# Patient Record
Sex: Female | Born: 1970 | Race: Black or African American | Hispanic: No | Marital: Married | Smoking: Never smoker
Health system: Southern US, Community
[De-identification: ages and names within clinical notes are randomized; demographics above are authoritative.]

## PROBLEM LIST (undated history)

## (undated) DIAGNOSIS — I8390 Asymptomatic varicose veins of unspecified lower extremity: Secondary | ICD-10-CM

## (undated) DIAGNOSIS — L659 Nonscarring hair loss, unspecified: Secondary | ICD-10-CM

## (undated) DIAGNOSIS — C169 Malignant neoplasm of stomach, unspecified: Secondary | ICD-10-CM

## (undated) DIAGNOSIS — A749 Chlamydial infection, unspecified: Secondary | ICD-10-CM

## (undated) HISTORY — DX: Nonscarring hair loss, unspecified: L65.9

## (undated) HISTORY — DX: Chlamydial infection, unspecified: A74.9

## (undated) HISTORY — DX: Asymptomatic varicose veins of unspecified lower extremity: I83.90

## (undated) HISTORY — DX: Malignant neoplasm of stomach, unspecified: C16.9

## (undated) HISTORY — PX: PORTACATH PLACEMENT: SHX2246

## (undated) HISTORY — PX: BREAST REDUCTION SURGERY: SHX8

---

## 2004-02-26 HISTORY — PX: BREAST LUMPECTOMY: SHX2

## 2011-08-27 ENCOUNTER — Emergency Department (HOSPITAL_COMMUNITY): Payer: Managed Care, Other (non HMO)

## 2011-08-27 ENCOUNTER — Emergency Department (HOSPITAL_COMMUNITY)
Admission: EM | Admit: 2011-08-27 | Discharge: 2011-08-27 | Disposition: A | Discharge status: Home / Self Care | Payer: Managed Care, Other (non HMO) | Attending: Emergency Medicine | Admitting: Emergency Medicine

## 2011-08-27 ENCOUNTER — Other Ambulatory Visit: Payer: Self-pay | Admitting: Internal Medicine

## 2011-08-27 ENCOUNTER — Encounter (HOSPITAL_COMMUNITY): Payer: Self-pay | Admitting: Emergency Medicine

## 2011-08-27 DIAGNOSIS — R197 Diarrhea, unspecified: Secondary | ICD-10-CM | POA: Insufficient documentation

## 2011-08-27 DIAGNOSIS — K5289 Other specified noninfective gastroenteritis and colitis: Secondary | ICD-10-CM | POA: Insufficient documentation

## 2011-08-27 DIAGNOSIS — K529 Noninfective gastroenteritis and colitis, unspecified: Secondary | ICD-10-CM

## 2011-08-27 DIAGNOSIS — R112 Nausea with vomiting, unspecified: Secondary | ICD-10-CM | POA: Insufficient documentation

## 2011-08-27 DIAGNOSIS — R109 Unspecified abdominal pain: Secondary | ICD-10-CM

## 2011-08-27 LAB — COMPREHENSIVE METABOLIC PANEL
Albumin: 3.4 g/dL — ABNORMAL LOW (ref 3.5–5.2)
Alkaline Phosphatase: 74 U/L (ref 39–117)
BUN: 3 mg/dL — ABNORMAL LOW (ref 6–23)
Calcium: 9.1 mg/dL (ref 8.4–10.5)
Creatinine, Ser: 0.59 mg/dL (ref 0.50–1.10)
Potassium: 3.3 mEq/L — ABNORMAL LOW (ref 3.5–5.1)
Total Protein: 7.7 g/dL (ref 6.0–8.3)

## 2011-08-27 LAB — CBC
HCT: 37.4 % (ref 36.0–46.0)
MCHC: 34.8 g/dL (ref 30.0–36.0)
RDW: 12.5 % (ref 11.5–15.5)

## 2011-08-27 LAB — URINALYSIS, ROUTINE W REFLEX MICROSCOPIC
Leukocytes, UA: NEGATIVE
Nitrite: NEGATIVE
Specific Gravity, Urine: 1.011 (ref 1.005–1.030)
pH: 6.5 (ref 5.0–8.0)

## 2011-08-27 LAB — LIPASE, BLOOD: Lipase: 13 U/L (ref 11–59)

## 2011-08-27 MED ORDER — CIPROFLOXACIN HCL 500 MG PO TABS: 500.0000 mg | ORAL_TABLET | Freq: Two times a day (BID) | ORAL | Status: AC

## 2011-08-27 MED ORDER — METRONIDAZOLE 500 MG PO TABS
500.0000 mg | ORAL_TABLET | Freq: Once | ORAL | Status: AC
  Administered 2011-08-27: 500 mg via ORAL
  Filled 2011-08-27: qty 1

## 2011-08-27 MED ORDER — ONDANSETRON HCL 4 MG/2ML IJ SOLN
4.0000 mg | Freq: Once | INTRAMUSCULAR | Status: AC
  Administered 2011-08-27: 4 mg via INTRAVENOUS
  Filled 2011-08-27: qty 2

## 2011-08-27 MED ORDER — MORPHINE SULFATE 4 MG/ML IJ SOLN
4.0000 mg | Freq: Once | INTRAMUSCULAR | Status: AC
  Administered 2011-08-27: 4 mg via INTRAVENOUS
  Filled 2011-08-27: qty 1

## 2011-08-27 MED ORDER — METRONIDAZOLE 500 MG PO TABS: 500.0000 mg | ORAL_TABLET | Freq: Two times a day (BID) | ORAL | Status: AC

## 2011-08-27 MED ORDER — SODIUM CHLORIDE 0.9 % IV BOLUS (SEPSIS)
1000.0000 mL | Freq: Once | INTRAVENOUS | Status: AC
  Administered 2011-08-27: 1000 mL via INTRAVENOUS

## 2011-08-27 MED ORDER — ONDANSETRON HCL 4 MG PO TABS: 4.0000 mg | ORAL_TABLET | Freq: Three times a day (TID) | ORAL | Status: AC | PRN

## 2011-08-27 MED ORDER — PERCOCET 5-325 MG PO TABS: 1.0000 | ORAL_TABLET | ORAL | Status: AC | PRN

## 2011-08-27 MED ORDER — CIPROFLOXACIN HCL 500 MG PO TABS
500.0000 mg | ORAL_TABLET | Freq: Once | ORAL | Status: AC
  Administered 2011-08-27: 500 mg via ORAL
  Filled 2011-08-27: qty 1

## 2011-08-27 MED ORDER — IOHEXOL 300 MG/ML  SOLN: 20.0000 mL | INTRAMUSCULAR | Status: AC

## 2011-08-27 MED ORDER — IOHEXOL 300 MG/ML  SOLN
80.0000 mL | Freq: Once | INTRAMUSCULAR | Status: AC | PRN
  Administered 2011-08-27: 80 mL via INTRAVENOUS

## 2011-08-27 MED ORDER — POTASSIUM CHLORIDE CRYS ER 20 MEQ PO TBCR
20.0000 meq | EXTENDED_RELEASE_TABLET | Freq: Once | ORAL | Status: AC
  Administered 2011-08-27: 20 meq via ORAL
  Filled 2011-08-27: qty 1

## 2011-08-27 NOTE — ED Notes (Signed)
States she is feeling better. Her stomach feels "a little sore"

## 2011-08-27 NOTE — ED Notes (Signed)
Pt eating [place] Sandwich  And snack before getting antibiotics due to not eating all day.

## 2011-08-27 NOTE — ED Notes (Signed)
Specimen cup given for stool culture.

## 2011-08-27 NOTE — ED Provider Notes (Signed)
11:37 AM Patient is in CDU holding for CT abd/pelvis.  Sign out received from Marlon Pel, PA-C.  Pt with diffuse abdominal pain, worse in LLQ, with N/V/D x 2 days.  Pt reports pain and nausea are currently well controlled, she has finished drinking the contrast.  On exam, pt is A&Ox4, NAD, RRR, CTAB, abd is soft, nondistended, diffusely ttp, worst in LLQ, no guarding, no rebound.  Will continue to follow.    3:44 PM Discussed results with patient.  Pt reports pain is starting to return, but generally well controlled.  Abdominal exam continues to be benign - soft, nondistended, diffuse tenderness worse on the left, no guarding, no rebound.  Plan is for d/c home with cipro, flagyl (first dose of both given here), percocet, zofran, GI and PCP follow up. Stool sample to be collected prior to d/c. Discussed return precautions.  Patient verbalizes understanding and agrees with plan.    Results for orders placed during the hospital encounter of 08/27/11  CBC      Component Value Range   WBC 5.7  4.0 - 10.5 K/uL   RBC 4.30  3.87 - 5.11 MIL/uL   Hemoglobin 13.0  12.0 - 15.0 g/dL   HCT 16.1  09.6 - 04.5 %   MCV 87.0  78.0 - 100.0 fL   MCH 30.2  26.0 - 34.0 pg   MCHC 34.8  30.0 - 36.0 g/dL   RDW 40.9  81.1 - 91.4 %   Platelets 325  150 - 400 K/uL  COMPREHENSIVE METABOLIC PANEL      Component Value Range   Sodium 135  135 - 145 mEq/L   Potassium 3.3 (*) 3.5 - 5.1 mEq/L   Chloride 99  96 - 112 mEq/L   CO2 25  19 - 32 mEq/L   Glucose, Bld 85  70 - 99 mg/dL   BUN 3 (*) 6 - 23 mg/dL   Creatinine, Ser 7.82  0.50 - 1.10 mg/dL   Calcium 9.1  8.4 - 95.6 mg/dL   Total Protein 7.7  6.0 - 8.3 g/dL   Albumin 3.4 (*) 3.5 - 5.2 g/dL   AST 12  0 - 37 U/L   ALT 13  0 - 35 U/L   Alkaline Phosphatase 74  39 - 117 U/L   Total Bilirubin 0.4  0.3 - 1.2 mg/dL   GFR calc non Af Amer >90  >90 mL/min   GFR calc Af Amer >90  >90 mL/min  LIPASE, BLOOD      Component Value Range   Lipase 13  11 - 59 U/L    URINALYSIS, ROUTINE W REFLEX MICROSCOPIC      Component Value Range   Color, Urine YELLOW  YELLOW   APPearance CLEAR  CLEAR   Specific Gravity, Urine 1.011  1.005 - 1.030   pH 6.5  5.0 - 8.0   Glucose, UA NEGATIVE  NEGATIVE mg/dL   Hgb urine dipstick NEGATIVE  NEGATIVE   Bilirubin Urine NEGATIVE  NEGATIVE   Ketones, ur 40 (*) NEGATIVE mg/dL   Protein, ur NEGATIVE  NEGATIVE mg/dL   Urobilinogen, UA 0.2  0.0 - 1.0 mg/dL   Nitrite NEGATIVE  NEGATIVE   Leukocytes, UA NEGATIVE  NEGATIVE  PREGNANCY, URINE      Component Value Range   Preg Test, Ur NEGATIVE  NEGATIVE   Ct Abdomen Pelvis W Contrast  08/27/2011  *RADIOLOGY REPORT*  Clinical Data: Periumbilical and left lower quadrant pain.  Nausea vomiting and diarrhea.  Chills since Sunday.  CT ABDOMEN AND PELVIS WITH CONTRAST  Technique:  Multidetector CT imaging of the abdomen and pelvis was performed following the standard protocol during bolus administration of intravenous contrast.  Contrast: 80mL OMNIPAQUE IOHEXOL 300 MG/ML  SOLN  Comparison: None.  Findings: Mild bibasilar atelectasis at the lung bases. Normal heart size without pericardial or pleural effusion.  Normal liver, spleen, stomach, pancreas, gallbladder, biliary tract, adrenal glands.  Normal kidneys.  Small retroperitoneal nodes, without adenopathy.  The rectum is normal.  There is wall thickening throughout the remainder of the colon which is mild to moderate.  The cecum is positioned within the right central pelvis.  The terminal ileum is underdistended, but may be thick-walled on image 64 of series 2.  Appendix likely diminutive and normal on coronal image 39.  Normal caliber of small bowel loops.  Prominent ileocolic mesenteric nodes.  Index node measures 8 mm on image 51. No pneumatosis or free intraperitoneal air.  No ascites.  No pelvic sidewall adenopathy.  Normal urinary bladder and uterus. No adnexal mass.  Trace cul-de-sac fluid could be physiologic or related to the bowel  process.  No acute osseous abnormality.  IMPRESSION:  1.  Relatively diffuse colitis, sparing the rectum.  Suspicion of concurrent terminal ileitis (evaluation of this area degraded by underdistension).  Considerations include acute infectious colitis or a somewhat atypical distribution/presentation of Crohn's disease.  Ulcerative colitis is felt unlikely, given absence of the distal involvement. 2.  Prominent ileocolic mesenteric nodes; likely reactive.  Original Report Authenticated By: Consuello Bossier, M.D.      Dillard Cannon [place], [place] 08/27/11 [phone]

## 2011-08-27 NOTE — ED Notes (Signed)
Pt. C/o of chills starting on Friday and on Sunday began having diarrhea and vomiting. Pt. Has taken aleve with no relief.

## 2011-08-27 NOTE — ED Provider Notes (Signed)
History     CSN: [id number]  Arrival date & time 08/27/11  4098   First MD Initiated Contact with Patient 08/27/11 [phone]      Chief Complaint  Patient presents with  . Diarrhea  . Emesis    (Consider location/radiation/quality/duration/timing/severity/associated sxs/prior treatment) HPI   Pt presents to the ED with complaints of severe abdominal pain, with cramping, diarrhea and vomiting since Friday. She states that last night wast he worst of all of the days. She admits to 10 episodes of diarrhea per day and a total of 3 episodes of vomiting. She says the pain is epigastric, periumbilical and then worse in the LLQ. She says that she saw her PCP yesterday who ordered a CT scan but she never received a phone call about where to go or when so she came to the ER for help. She denies having a history of abdominal pains, denies having a scan before. She denies any urinary or vaginal complaints, denies chest pains. Denies being around other sick contacts. Pt is in NAD at this time and VSS.  History reviewed. No pertinent past medical history.  History reviewed. No pertinent past surgical history.  History reviewed. No pertinent family history.  History  Substance Use Topics  . Smoking status: Not on file  . Smokeless tobacco: Not on file  . Alcohol Use: Not on file    OB History    Grav Para Term Preterm Abortions TAB SAB Ect Mult Living                  Review of Systems   HEENT: denies blurry vision or change in hearing PULMONARY: Denies difficulty breathing and SOB CARDIAC: denies chest pain or heart palpitations MUSCULOSKELETAL:  denies being unable to ambulate ABDOMEN AL: abdominal pain with N/V/D GU: denies loss of bowel or urinary control NEURO: denies numbness and tingling in extremities SKIN: no new rashes PSYCH: patient denies anxiety or depression. NECK: Pt denies having neck pain     Allergies  Review of patient's allergies indicates no known  allergies.  Home Medications   Current Outpatient Rx  Name Route Sig Dispense Refill  . NORETHINDRONE-ETH ESTRADIOL 0.4-35 MG-MCG PO TABS Oral Take 1 tablet by mouth daily.      BP 130/86  Pulse 106  Temp 97.5 F (36.4 C) (Oral)  Resp 16  SpO2 100%  LMP 07/30/2011  Physical Exam  Constitutional: She appears well-developed and well-nourished. No distress.  HENT:  Head: Normocephalic and atraumatic.  Mouth/Throat: Uvula is midline.  Eyes: Pupils are equal, round, and reactive to light.  Neck: Trachea normal, normal range of motion and full passive range of motion without pain. Neck supple.  Cardiovascular: Normal rate, regular rhythm, normal heart sounds and normal pulses.   Pulmonary/Chest: Effort normal and breath sounds normal. No respiratory distress. Chest wall is not dull to percussion. She exhibits no tenderness, no crepitus, no edema, no deformity and no retraction.  Abdominal: Normal appearance. There is tenderness in the epigastric area, periumbilical area and left lower quadrant.    Musculoskeletal: Normal range of motion.  Neurological: She is alert. She has normal strength.  Skin: Skin is warm, dry and intact. She is not diaphoretic.  Psychiatric: She has a normal mood and affect. Her speech is normal. Cognition and memory are normal.    ED Course  Procedures (including critical care time)  Labs Reviewed - No data to display No results found.   No diagnosis found.  MDM   Pain and nausea medication ordered. Labs, IV bolus and CT abd/pelv ordered as well. Pt hand off to [facility], PA-C in the CDU to wait for scan and lab results for further dispo. My differential is diverticulitis, although she is not having fever, or gastroenteritis.         Dorthula Matas, PA 08/27/11 1005

## 2011-08-28 NOTE — ED Provider Notes (Signed)
Medical screening examination/treatment/procedure(s) were performed by non-physician practitioner and as supervising physician I was immediately available for consultation/collaboration.  Calle Schader, MD 08/28/11 1319 

## 2011-08-28 NOTE — ED Provider Notes (Signed)
Medical screening examination/treatment/procedure(s) were performed by non-physician practitioner and as supervising physician I was immediately available for consultation/collaboration.  Cyndra Numbers, MD 08/28/11 1319

## 2011-09-26 ENCOUNTER — Encounter: Payer: Self-pay | Admitting: Internal Medicine

## 2011-09-27 ENCOUNTER — Encounter: Payer: Self-pay | Admitting: Internal Medicine

## 2011-09-27 ENCOUNTER — Ambulatory Visit (INDEPENDENT_AMBULATORY_CARE_PROVIDER_SITE_OTHER): Payer: Managed Care, Other (non HMO) | Admitting: Internal Medicine

## 2011-09-27 VITALS — BP 126/80 | HR 84 | Ht 65.5 in | Wt 173.6 lb

## 2011-09-27 DIAGNOSIS — K529 Noninfective gastroenteritis and colitis, unspecified: Secondary | ICD-10-CM | POA: Insufficient documentation

## 2011-09-27 DIAGNOSIS — R933 Abnormal findings on diagnostic imaging of other parts of digestive tract: Secondary | ICD-10-CM

## 2011-09-27 MED ORDER — ALIGN PO CAPS: 1.0000 | ORAL_CAPSULE | Freq: Every day | ORAL | Status: AC

## 2011-09-27 NOTE — Patient Instructions (Addendum)
You have been given samples of align, take one capsule daily  Follow up with Dr. Rhea Belton in 1 month

## 2011-09-27 NOTE — Progress Notes (Signed)
Patient ID: Aireanna Luellen, female   DOB: 1970/08/30, 41 y.o.   MRN: [id number]  SUBJECTIVE: HPI Mrs. Laton is a 41 year old female with little past medical history who is seen in consultation at the request of the emergency room provider for evaluation of recent abdominal pain and diarrhea and abnormal GI imaging.  The patient reports in early July she developed acute diffuse abdominal pain. Her symptoms started suddenly, and were preceded for a few hours by what felt like a viral prodrome. She reports diffuse abdominal pain followed by profuse diarrhea. Her diarrhea was watery and occurring 10-20 times a day. It occasionally was bloody, and she eventually sought care in the emergency department. There she was hydrated, given pain medication, and CT scan was performed which revealed diffuse colitis. Today she presents alone, for further evaluation. She reports that overall she is much better than when she presented to the ER on 08/27/2011.  She reports that she is no longer having diarrhea, but her bowel habits have not returned completely to normal. Normal for her is a bowel movement 2-3 times weekly. She is currently having one to 2 stools per day. Her stools are much more consistent and are starting to have more formed. She has seen some mucus in her blood, but no further bleeding. Her abdominal pain is also significantly better and she describes occasional "faint" pain in her left abdomen. This is rare. She's not having tenesmus. He is not having fevers or chills. Her appetite has been good. No nausea or vomiting. No joint symptoms. No rash. Some perianal irritation but no bleeding. All of this occurred without known sick contacts. She recalls eating a salad a day or so before her symptoms started. She was not drinking well water at the time but is now.  She does recall history of diverticulitis diagnosed in 2003 which was treated and resolved. She recalls this was diagnosed by CT scan. She's never had a  colonoscopy.  Review of Systems  As per history of present illness, otherwise negative   PMH - none  Current Outpatient Prescriptions  Medication Sig Dispense Refill  . norethindrone-ethinyl estradiol (ZENCHENT) 0.4-35 MG-MCG tablet Take 1 tablet by mouth daily.      . bifidobacterium infantis (ALIGN) capsule Take 1 capsule by mouth daily.  14 capsule  0    No Known Allergies  Family History  Problem Relation Age of Onset  . Diabetes Sister   . Hypertension Mother   . Hypertension Brother   . Colon cancer Neg Hx     History  Substance Use Topics  . Smoking status: Never Smoker   . Smokeless tobacco: Never Used  . Alcohol Use: Yes     very rare    OBJECTIVE: BP 126/80  Pulse 84  Ht 5' 5.5" (1.664 m)  Wt 173 lb 9.6 oz (78.744 kg)  BMI 28.45 kg/m2  LMP 09/03/2011 BP 126/80  Pulse 84  Ht 5' 5.5" (1.664 m)  Wt 173 lb 9.6 oz (78.744 kg)  BMI 28.45 kg/m2  LMP 09/03/2011 Constitutional: Well-developed and well-nourished. No distress. HEENT: Normocephalic and atraumatic. Oropharynx is clear and moist. No oropharyngeal exudate. Conjunctivae are normal. Pupils are equal round and reactive to light. No scleral icterus. Neck: Neck supple. Trachea midline. Cardiovascular: Normal rate, regular rhythm and intact distal pulses. No M/R/G Pulmonary/chest: Effort normal and breath sounds normal. No wheezing, rales or rhonchi. Abdominal: Soft, mild diffuse tenderness without rebound or guarding, nondistended. Bowel sounds active throughout. There are no masses  palpable. No hepatosplenomegaly. Extremities: no clubbing, cyanosis, or edema Lymphadenopathy: No cervical adenopathy noted. Neurological: Alert and oriented to person place and time. Skin: Skin is warm and dry. No rashes noted. Psychiatric: Normal mood and affect. Behavior is normal.  Labs and Imaging -- CT ABDOMEN AND PELVIS WITH CONTRAST -- 08/27/2011   Technique:  Multidetector CT imaging of the abdomen and pelvis  was performed following the standard protocol during bolus administration of intravenous contrast.   Contrast: 80mL OMNIPAQUE IOHEXOL 300 MG/ML  SOLN   Comparison: None.   Findings: Mild bibasilar atelectasis at the lung bases. Normal heart size without pericardial or pleural effusion.  Normal liver, spleen, stomach, pancreas, gallbladder, biliary tract, adrenal glands.  Normal kidneys.  Small retroperitoneal nodes, without adenopathy.  The rectum is normal.  There is wall thickening throughout the remainder of the colon which is mild to moderate.   The cecum is positioned within the right central pelvis.  The terminal ileum is underdistended, but may be thick-walled on image 64 of series 2.  Appendix likely diminutive and normal on coronal image 39.   Normal caliber of small bowel loops.  Prominent ileocolic mesenteric nodes.  Index node measures 8 mm on image 51. No pneumatosis or free intraperitoneal air.  No ascites.   No pelvic sidewall adenopathy.  Normal urinary bladder and uterus. No adnexal mass.  Trace cul-de-sac fluid could be physiologic or related to the bowel process.   No acute osseous abnormality.   IMPRESSION:   1.  Relatively diffuse colitis, sparing the rectum.  Suspicion of concurrent terminal ileitis (evaluation of this area degraded by underdistension).  Considerations include acute infectious colitis or a somewhat atypical distribution/presentation of Crohn's disease.  Ulcerative colitis is felt unlikely, given absence of the distal involvement. 2.  Prominent ileocolic mesenteric nodes; likely reactive.   ASSESSMENT AND PLAN: 41 year old female with little past medical history who is seen in consultation at the request of the emergency room provider for evaluation of recent abdominal pain and diarrhea and abnormal GI imaging.  1. Recent diarrhea/abdominal pain - now improving/abnl GI imaging -- the patient's constellation of symptoms and CT findings  are most consistent with an acute infectious colitis and possibly enteritis. Overall she is much improved, but her bowel habits have not returned completely to normal. Perhaps, this is secondary to a post infectious irritable bowel type phenomenon. At this point my suspicion for inflammatory bowel disease is low given the acuity of her symptoms, the lack of preceding bowel issues, and her significant improvement to this point. For now I recommend continued observation, and initiation of probiotic. I would like to see her back in one month's time. If she is not had complete return to normal bowel function and complete resolution of abdominal pain, then my recommendation will be for colonoscopy for direct visualization. She understands this plan and is happy. She will take Align one capsule daily. I've asked that she call us if her symptoms change or worsen before followup.

## 2011-10-25 ENCOUNTER — Encounter: Payer: Self-pay | Admitting: Internal Medicine

## 2011-10-29 ENCOUNTER — Ambulatory Visit: Payer: Managed Care, Other (non HMO) | Admitting: Internal Medicine

## 2012-05-26 ENCOUNTER — Telehealth: Payer: Self-pay | Admitting: *Deleted

## 2012-05-26 NOTE — Telephone Encounter (Signed)
Call to patient to follow up on choice for BTSP or ESSURE and date preference.  LMTCB.

## 2012-06-05 NOTE — Telephone Encounter (Signed)
2nd call to patient checking on date preferences for procedure. LMTCB

## 2012-06-15 ENCOUNTER — Telehealth: Payer: Self-pay | Admitting: *Deleted

## 2012-06-15 NOTE — Telephone Encounter (Signed)
Patient returning my call regarding scheduling procedure.  She left message on my VM that her son is having surgery and she will need to delay this procedure indefinitely.  She will call back when ready to schedule.Attempted to return patients call to confirm.  Lm on her VM, to call prn and wished well with son.

## 2012-06-17 ENCOUNTER — Other Ambulatory Visit: Payer: Self-pay | Admitting: Obstetrics & Gynecology

## 2012-06-17 MED ORDER — NORETHIN-ETH ESTRADIOL-FE 0.4-35 MG-MCG PO CHEW: 1.0000 | CHEWABLE_TABLET | Freq: Every day | ORAL | Status: DC

## 2012-08-17 ENCOUNTER — Telehealth: Payer: Self-pay | Admitting: Obstetrics & Gynecology

## 2012-08-17 NOTE — Telephone Encounter (Signed)
LMTCB  aa 

## 2012-08-17 NOTE — Telephone Encounter (Signed)
MMG-routine not covered for this year per call from [employer]. (Only one covered between ages 35-39.)Representative states if the doctor wants to change the DX code if there is a breast problem, it may be covered. Please advise [employer].

## 2012-08-25 NOTE — Telephone Encounter (Signed)
Left message on home # of need to call office regarding information from insurance [employer] of mammogram coverage.

## 2012-08-26 NOTE — Telephone Encounter (Signed)
Left message on home # to call office for [employer] coverage of mammogram..

## 2012-09-01 NOTE — Telephone Encounter (Signed)
She will be 42 in November and then it won't be an issue.  I feel it is totally fine to wait until then.

## 2012-09-01 NOTE — Telephone Encounter (Signed)
She is having trouble with the MMG from March of 2014 being denied since they paid the one in 04-2011.

## 2012-09-01 NOTE — Telephone Encounter (Signed)
Patient called in with questions about MMG and I returned her call with Shanda Bumps from [employer] and [address].  Patients current policy with [employer] started 01-2011.  Her 04-2011 screening MMG was covered and her 04-2012 has been denied since Monia Pouch only covers one screening from age 40-39.  Patient reports a history of lumpectomy 2006 and has yearly MMG and never had this problem.  States Dr Hyacinth Meeker even recommended the 3D imaging for her MMG due to fibrocystic breasts.  Explained that 2013 MMG was also a screening and states she was due for repeat screening in one year.  Also explained that we cant change codes based on insurance coverage.  Patient upset about code being routine because of her history.  Explained her past issue was resolved and previous MMG was normal and no new problem was identified that indicated diagnostic MMG was needed.  Acknowledged that with her history, screening MMG certainly more indicated for her than the average patient between 35-39 and that perhaps appeal of this decision is an option.  Need to check with MD first regarding code and diagnosis and will call her back.  Please advise.  Chart to your office.

## 2012-09-03 NOTE — Telephone Encounter (Signed)
MMG was done for screening.  I feel inappropriate to change code.  I'm sorry.

## 2012-09-09 NOTE — Telephone Encounter (Signed)
Call to patient with update approx 1530. LMTCB

## 2012-09-11 ENCOUNTER — Telehealth: Payer: Self-pay | Admitting: Obstetrics & Gynecology

## 2012-09-11 NOTE — Telephone Encounter (Signed)
See previous phone note.  

## 2012-09-11 NOTE — Telephone Encounter (Signed)
Patient notified Dr Hyacinth Meeker has reviewed chart and although she has extenuating circumstances, the order was for a screening MMG and can not change that.

## 2012-09-11 NOTE — Telephone Encounter (Signed)
Patient called stating that Kennon Rounds had called her and left a message saying that she needed to speak with patient concerning "follow-up". Patient stated that she can be reached on her home phone.

## 2013-06-17 ENCOUNTER — Other Ambulatory Visit: Payer: Self-pay | Admitting: Obstetrics & Gynecology

## 2013-06-17 NOTE — Telephone Encounter (Signed)
aex was 04-30-12. Last refilled for 1 yr 06-17-12

## 2013-07-21 ENCOUNTER — Ambulatory Visit: Payer: Self-pay | Admitting: Obstetrics & Gynecology

## 2013-07-23 ENCOUNTER — Ambulatory Visit: Payer: Self-pay | Admitting: Obstetrics & Gynecology

## 2013-08-12 ENCOUNTER — Telehealth: Payer: Self-pay | Admitting: Obstetrics & Gynecology

## 2013-08-12 NOTE — Telephone Encounter (Signed)
Pt says she has some type of infection. Please call to schedule an appointment.

## 2013-08-12 NOTE — Telephone Encounter (Signed)
Spoke with patient. Advised we could get her in early Monday morning to see an NP and that she could use OTC Monistat 3 and hydrocortisone ointment over the weekend for relief. Patient states that she will be out of town all next week and has an appointment the following week with Dr.Miller. Patient states she will use OTC at this time. Advised patient that if symptoms worsen over the weekend she could be seen at urgent care for treatment. Patient agreeable and verbalizes understanding.  Routing to provider for final review. Patient agreeable to disposition. Will close encounter

## 2013-08-12 NOTE — Telephone Encounter (Signed)
Spoke with patient. Patient states that she has been experiencing vaginal itching and burning since last Sunday. Patient switched to a different kind of soap and that is when the symptoms began. Patient has switched back to old soap and states that the symptoms have not gone away. Patient notes a little discharge with urination that is white in color with no odor. Patient requesting appointment as she states that "It is not getting better and I want to get it checked before it gets worse." Advised patient would have to check scheduling for tomorrow and give patient a call back with further instructions and appointments. Patient agreeable.

## 2013-08-20 ENCOUNTER — Encounter: Payer: Self-pay | Admitting: Obstetrics & Gynecology

## 2013-08-23 ENCOUNTER — Ambulatory Visit: Payer: Self-pay | Admitting: Obstetrics & Gynecology

## 2013-08-23 ENCOUNTER — Other Ambulatory Visit: Payer: Self-pay | Admitting: Obstetrics & Gynecology

## 2013-08-23 ENCOUNTER — Telehealth: Payer: Self-pay | Admitting: Obstetrics & Gynecology

## 2013-08-23 NOTE — Telephone Encounter (Signed)
Patient came in for aex had two children with her per starla we had to reschedule her appt. She was not happy about it and just walked out the door i put pt in recall and starla wrote her name down for cancellations to reschedule her

## 2013-08-23 NOTE — Telephone Encounter (Signed)
You are welcome to just send a letter if you want and not call.  She may decide not to return.  Encounter closed.

## 2013-08-30 ENCOUNTER — Other Ambulatory Visit: Payer: Self-pay | Admitting: Obstetrics & Gynecology

## 2013-08-30 NOTE — Telephone Encounter (Signed)
Last AEX: N/A Last refill:06/17/13 #84, 0 refills Current AEX:pt is due  Called pt to schedule AEX, No answer or VM  Please advise

## 2013-08-30 NOTE — Telephone Encounter (Signed)
Returning is calling regarding her prescription request.

## 2013-08-31 NOTE — Telephone Encounter (Signed)
Spoke with Pt. Pt wanted to be seen by Dr. Sabra Heck but with her schedule I was not able to do that. Pt will be seeing Dr. Charlies Constable on 10/06/13 @ 4:00 for her AEX. I will refill 1 mth of OCP to cover pt till her AEX. I explained to pt that she will need an AEX for more refills. Pt agreed and voiced understanding.   Encounter closed

## 2013-09-28 ENCOUNTER — Other Ambulatory Visit: Payer: Self-pay | Admitting: Obstetrics & Gynecology

## 2013-09-29 NOTE — Telephone Encounter (Signed)
eScribe request from [facility] for refill on [facility] Last filled - 08/31/13 X 1 PACK Last AEX - 08/23/12 Next AEX - 10/06/13 Last appt rescheduled by provider:  Cancel Rsn: Provider (pt had children with her) 1 pack sent until AEX.

## 2013-10-04 ENCOUNTER — Other Ambulatory Visit: Payer: Self-pay | Admitting: Obstetrics & Gynecology

## 2013-10-05 ENCOUNTER — Telehealth: Payer: Self-pay | Admitting: Emergency Medicine

## 2013-10-05 MED ORDER — NORETHIN-ETH ESTRADIOL-FE 0.4-35 MG-MCG PO CHEW: 1.0000 | CHEWABLE_TABLET | Freq: Every day | ORAL | Status: DC

## 2013-10-05 NOTE — Telephone Encounter (Signed)
Per Olivia Mackie, patient had some insurance questions prior to her AEX 10/06/13 but she hung up before I could answer the waiting line. I left patient a message to call back.

## 2013-10-05 NOTE — Telephone Encounter (Signed)
Last AEX: 08/23/13 Last refill:09/29/13 #28 X 0 Current AEX:10/06/13  Pt AEX is on 10/06/13. Refills will be done then Encounter closed

## 2013-10-05 NOTE — Telephone Encounter (Signed)
Patient calling to advise that [facility] [address].  She states she is "dissapointed in your office because now I have to start my pills over again."  Advised that according to our records, the pharmacy confirmed receipt of e-scribed rx and that I apologize but the prescription was sent.   Patient has annual exam scheduled for tomorrow with Dr. Charlies Constable. I advised I could send one pack to pharmacy and that if appropriate, Dr. Charlies Constable can refill additional rx.   Patient has questions about her insurance coverage for tomorrow's appointment so was transferred to [facility].   [facility]ed [facility] at [phone] and spoke with Cristie Hem and ensured that escribed rx was received, he also took the order by phone as well.   Routing to provider for final review. Patient agreeable to disposition. Will close encounter

## 2013-10-06 ENCOUNTER — Ambulatory Visit: Payer: BC Managed Care – PPO | Admitting: Gynecology

## 2013-10-11 ENCOUNTER — Telehealth: Payer: Self-pay | Admitting: Gynecology

## 2013-10-11 ENCOUNTER — Ambulatory Visit: Payer: Self-pay | Admitting: Gynecology

## 2013-10-11 NOTE — Telephone Encounter (Signed)
Patient called said her father in law passed away unexpected and she has to go out of town.Pt cancel appt for today at 4 and said she would call back to reschedule. Put in recall.

## 2013-11-15 ENCOUNTER — Telehealth: Payer: Self-pay | Admitting: Gynecology

## 2013-11-15 MED ORDER — NORETHIN-ETH ESTRADIOL-FE 0.4-35 MG-MCG PO CHEW: 1.0000 | CHEWABLE_TABLET | Freq: Every day | ORAL | Status: DC

## 2013-11-15 NOTE — Telephone Encounter (Signed)
Pt is requesting a refill for her birth control. She canceled her last appointment due to a funeral. She is now scheduled for 12/08/13 with Dr. Charlies Constable.

## 2013-11-15 NOTE — Telephone Encounter (Signed)
Last refilled: 10/05/13 #1 pack/0rf  Last AEX: 04/30/12 with Dr.Miller AEX Scheduled: 12/08/13 with Dr. Charlies Constable  1 pack of [facility] sent to [facility] on [address]  Routed to provider for review, encounter closed.

## 2013-11-15 NOTE — Telephone Encounter (Signed)
LM on patient's VM that rx has been sent.

## 2013-12-08 ENCOUNTER — Ambulatory Visit (INDEPENDENT_AMBULATORY_CARE_PROVIDER_SITE_OTHER): Payer: BC Managed Care – PPO | Admitting: Gynecology

## 2013-12-08 ENCOUNTER — Encounter: Payer: Self-pay | Admitting: Gynecology

## 2013-12-08 VITALS — BP 136/82 | HR 76 | Resp 14 | Ht 65.0 in | Wt 185.0 lb

## 2013-12-08 DIAGNOSIS — Z3041 Encounter for surveillance of contraceptive pills: Secondary | ICD-10-CM

## 2013-12-08 DIAGNOSIS — Z01419 Encounter for gynecological examination (general) (routine) without abnormal findings: Secondary | ICD-10-CM

## 2013-12-08 DIAGNOSIS — Z0000 Encounter for general adult medical examination without abnormal findings: Secondary | ICD-10-CM

## 2013-12-08 LAB — POCT URINALYSIS DIPSTICK
Leukocytes, UA: NEGATIVE
Urobilinogen, UA: NEGATIVE
pH, UA: 5

## 2013-12-08 LAB — HEMOGLOBIN, FINGERSTICK: Hemoglobin, fingerstick: 13 g/dL (ref 12.0–16.0)

## 2013-12-08 MED ORDER — NORETHIN ACE-ETH ESTRAD-FE 1-20 MG-MCG(24) PO CHEW: 1.0000 | CHEWABLE_TABLET | Freq: Every day | ORAL | Status: DC

## 2013-12-08 NOTE — Progress Notes (Signed)
43 y.o. Married [place] American female   G2P2 here for annual exam. Pt is currently sexually active.  Pt is on generic ovcon 35 for chewable benefits.  Pt is without complaints.    Patient's last menstrual period was 12/01/2013.          Sexually active: Yes.    The current method of family planning is OCP (estrogen/progesterone).    Exercising: Yes.    Walking  daily  Last pap: 04/08/11 NEG HR HPV not indicated  Alcohol: 1-2 Drinks/wk Tobacco: no BSE: yes Mammogram: 05/21/13 Bi-Rads 2  Hgb: 13.0 ; Urine: Negative  Health Maintenance  Topic Date Due  . Pap Smear  01/21/1991  . Tetanus/tdap  01/21/1992  . Influenza Vaccine  09/25/2013    Family History  Problem Relation Age of Onset  . Diabetes Sister   . Hypertension Mother   . Hypertension Brother   . Colon cancer Neg Hx   . Lung cancer Maternal Grandmother   . Breast cancer Paternal Grandmother 28    Patient Active Problem List   Diagnosis Date Noted  . Colitis - presumed infectious origin 09/27/2011    Past Medical History  Diagnosis Date  . Varicose vein   . Chlamydia     h/o positive    Past Surgical History  Procedure Laterality Date  . Cesarean section      x 2  . Breast reduction surgery      bilateral    Allergies: Review of patient's allergies indicates no known allergies.  Current Outpatient Prescriptions  Medication Sig Dispense Refill  . [place] FE 0.4-35 MG-MCG tablet TAKE 1 TABLET EVERY DAY  28 tablet  0   No current facility-administered medications for this visit.    ROS: Pertinent items are noted in HPI.  Exam:    BP 136/82  Pulse 76  Resp 14  Ht 5\' 5"  (1.651 m)  Wt 185 lb (83.915 kg)  BMI 30.79 kg/m2  LMP 12/01/2013 Weight change: @WEIGHTCHANGE @ Last 3 height recordings:  Ht Readings from Last 3 Encounters:  12/08/13 5\' 5"  (1.651 m)  09/27/11 5' 5.5" (1.664 m)   General appearance: alert, cooperative and appears stated age Head: Normocephalic, without obvious abnormality,  atraumatic Neck: no adenopathy, no carotid bruit, no JVD, supple, symmetrical, trachea midline and thyroid not enlarged, symmetric, no tenderness/mass/nodules Lungs: clear to auscultation bilaterally Breasts: normal appearance, no masses or tenderness Heart: regular rate and rhythm, S1, S2 normal, no murmur, click, rub or gallop Abdomen: soft, non-tender; bowel sounds normal; no masses,  no organomegaly Extremities: extremities normal, atraumatic, no cyanosis or edema Skin: Skin color, texture, turgor normal. No rashes or lesions Lymph nodes: Cervical, supraclavicular, and axillary nodes normal. no inguinal nodes palpated Neurologic: Grossly normal   Pelvic: External genitalia:  no lesions              Urethra: normal appearing urethra with no masses, tenderness or lesions              Bartholins and Skenes: Bartholin's, Urethra, Skene's normal                 Vagina: normal appearing vagina with normal color and discharge, no lesions              Cervix: normal appearance              Pap taken: No.        Bimanual Exam:  Uterus:  uterus is normal size, shape, consistency and  nontender                                      Adnexa:    normal adnexa in size, nontender and no masses                                      Rectovaginal: Confirms                                      Anus:  normal sphincter tone, no lesions       1. Laboratory examination ordered as part of a routine general medical examination  - POCT Urinalysis Dipstick - Hemoglobin, fingerstick  2. Encounter for routine gynecological examination  counseled on breast self exam, mammography screening, use and side effects of OCP's, diet and exercise return annually or prn  3. Encounter for surveillance of contraceptive pills Elevated BP noted on exam today, discussed change in contraception.  Pt reports loss of hair with nexplanon after her last child.  Did not have essure as planned.  Suggest decreasing dose of  ocp-needs chewable.  Pt asked to call with BP's once on lower dose pill. agrees - Norethin Ace-Eth Estrad-FE (MINASTRIN 24 FE) 1-20 MG-MCG(24) CHEW; Chew 1 tablet by mouth daily.  Dispense: 28 tablet; Refill: 12   An After Visit Summary was printed and given to the patient.

## 2013-12-10 ENCOUNTER — Telehealth: Payer: Self-pay | Admitting: Gynecology

## 2013-12-10 NOTE — Telephone Encounter (Signed)
Patient had office visit with Dr. Charlies Constable 12/08/13. Pap smear not taken.  Will advise patient.   Last pap: 04/08/11 NEG HR HPV not indicated

## 2013-12-10 NOTE — Telephone Encounter (Signed)
Patient returned call and patient was advised that per Dr. Brion Aliment note, a pap smear was not collected with this visit.  Patient is advised that last pap smear was 04/08/11 with negative hpv and a pap smear was not indicated per Dr. Charlies Constable. Discussed new guidelines, new guidelines patient can have pap smear q 3-5 years or earlier if history changes. Advised pelvic exam and breast check with annual still recommend each year.  Patient is agreeable to this. She will call back if she would like to have pap smear done.  Routing to provider for final review. Patient agreeable to disposition. Will close encounter

## 2013-12-10 NOTE — Telephone Encounter (Signed)
Patient calling to check on status of call. I did let her know it takes approximately 2 weeks for results to be ready as she was seen 12/08/13. Patient understands.

## 2013-12-10 NOTE — Telephone Encounter (Signed)
Patient is asking for recent pap results.

## 2013-12-17 ENCOUNTER — Other Ambulatory Visit: Payer: Self-pay | Admitting: Gynecology

## 2013-12-27 ENCOUNTER — Encounter: Payer: Self-pay | Admitting: Gynecology

## 2014-01-05 ENCOUNTER — Telehealth: Payer: Self-pay | Admitting: Obstetrics & Gynecology

## 2014-01-05 NOTE — Telephone Encounter (Signed)
Pt says she is not sure if she has a yeast infection but something does not feel right. Requested Dr Sabra Heck.

## 2014-01-05 NOTE — Telephone Encounter (Signed)
Left message to call Kaitlyn at [phone]. 

## 2014-01-06 ENCOUNTER — Ambulatory Visit: Payer: BC Managed Care – PPO | Admitting: Obstetrics & Gynecology

## 2014-01-06 NOTE — Telephone Encounter (Signed)
Per Dr Sabra Heck, work in [place] this afternoon. Patient notified, will come now.  Routing to provider for final review. Patient agreeable to disposition. Will close encounter

## 2014-01-06 NOTE — Telephone Encounter (Signed)
Pt is returning a call to [facility]

## 2014-01-06 NOTE — Telephone Encounter (Signed)
Patient very uncomfortable with external vulvar irritation. Initially started around time of AEX but nothing found at AEX. AEX with Dr Charlies Constable 12-08-13.  Then had menses and symptoms has increased. Has also changed to SafeGuard soap (unintentional).  Complains of white sticky discharge, no odor, that is more obvious at upper part of labia and around clitoris. Skin red and itching. Denies pelvic pain or fever.  Willing to come in but request Dr Sabra Heck if OV required. Please advise.

## 2014-01-06 NOTE — Telephone Encounter (Signed)
Patient returning call to triage about scheduling appointment ASAP for possible yeast infection

## 2014-01-07 ENCOUNTER — Ambulatory Visit (INDEPENDENT_AMBULATORY_CARE_PROVIDER_SITE_OTHER): Payer: BC Managed Care – PPO | Admitting: Obstetrics & Gynecology

## 2014-01-07 VITALS — BP 126/80 | HR 72 | Resp 16 | Ht 65.0 in | Wt 186.4 lb

## 2014-01-07 DIAGNOSIS — B3731 Acute candidiasis of vulva and vagina: Secondary | ICD-10-CM

## 2014-01-07 DIAGNOSIS — B373 Candidiasis of vulva and vagina: Secondary | ICD-10-CM

## 2014-01-07 MED ORDER — FLUCONAZOLE 150 MG PO TABS: 150.0000 mg | ORAL_TABLET | Freq: Once | ORAL | Status: DC

## 2014-01-07 MED ORDER — TERCONAZOLE 0.4 % VA CREA: 1.0000 | TOPICAL_CREAM | Freq: Every day | VAGINAL | Status: DC

## 2014-01-07 NOTE — Progress Notes (Signed)
Subjective:     Patient ID: Beth Harrison, female   DOB: 05-Jan-1971, 43 y.o.   MRN: [id number]  HPI 43 yo G1P1 MAAF here for complaints of external and vaginal itching with whitish discharge.  Reports she informed Dr. Charlies Constable of this on 12/08/13 and "everything was normal".  Itching and irritation has just continued and now pt can hardly stand it.  Denies new sexual partner and no concerns.  Denies urinary symptoms other than a little skin burning with urination but she feels this is due to the irritation.  Has not used anything for this.  Review of Systems  All other systems reviewed and are negative.      Objective:   Physical Exam  Constitutional: She is oriented to person, place, and time. She appears well-developed and well-nourished.  Genitourinary:    There is no rash, tenderness, lesion or injury on the right labia. There is no rash, tenderness, lesion or injury on the left labia. No erythema, tenderness or bleeding in the vagina. No foreign body around the vagina. Vaginal discharge (white, adherent) found.  Musculoskeletal: Normal range of motion.  Lymphadenopathy:       Right: No inguinal adenopathy present.       Left: No inguinal adenopathy present.  Neurological: She is alert and oriented to person, place, and time.  Skin: Skin is warm and dry.  Psychiatric: She has a normal mood and affect.       Assessment:     Yeast vaginitis    Plan:     Because of inner labia excoriation and duration with treat with both Diflucan 150mg  po x 1, repeat 48-72 hours and terazol 7 qhs and externally x 7days.  Pt knows to call if symptoms do not significantly improve over next few days.

## 2014-01-23 ENCOUNTER — Encounter: Payer: Self-pay | Admitting: Obstetrics & Gynecology

## 2014-03-31 ENCOUNTER — Other Ambulatory Visit: Payer: Self-pay | Admitting: Obstetrics & Gynecology

## 2014-03-31 DIAGNOSIS — Z3041 Encounter for surveillance of contraceptive pills: Secondary | ICD-10-CM

## 2014-03-31 NOTE — Telephone Encounter (Signed)
Patient calling requesting refills in 90 day increments on Minastrin 24 FE be sent to pharmacy below. She has new Programmer, applications.  [facility] [phone]

## 2014-04-01 MED ORDER — NORETHIN ACE-ETH ESTRAD-FE 1-20 MG-MCG(24) PO CHEW: 1.0000 | CHEWABLE_TABLET | Freq: Every day | ORAL | Status: DC

## 2014-04-01 NOTE — Telephone Encounter (Signed)
Medication refill request: Minastrin 24 Fe Last AEX:  12/08/13 Next AEX: none Last MMG (if hormonal medication request): 05/21/13 BIRADS2:Benign  Refill authorized: 12/08/13 #28tabs/12R. Today #84tabs/2R?

## 2014-05-11 ENCOUNTER — Telehealth: Payer: Self-pay | Admitting: Obstetrics & Gynecology

## 2014-05-11 NOTE — Telephone Encounter (Signed)
Pt says she would like to speak to nurse regarding her last appointment.

## 2014-05-11 NOTE — Telephone Encounter (Signed)
Spoke with patient. She would like to schedule annual exam with Dr. Sabra Heck as she is prior patient of Dr. Sabra Heck and has seen Dr. Charlies Constable for her last annual 12/08/13. Annual exam scheduled with Dr. Sabra Heck for 01/11/15 at 0915.

## 2014-05-11 NOTE — Telephone Encounter (Signed)
Message left to return call to Antone Summons at [phone].    

## 2014-06-19 ENCOUNTER — Encounter (HOSPITAL_COMMUNITY): Payer: Self-pay | Admitting: Nurse Practitioner

## 2014-06-19 ENCOUNTER — Emergency Department (HOSPITAL_COMMUNITY): Payer: BLUE CROSS/BLUE SHIELD

## 2014-06-19 ENCOUNTER — Emergency Department (HOSPITAL_COMMUNITY)
Admission: EM | Admit: 2014-06-19 | Discharge: 2014-06-19 | Disposition: A | Discharge status: Home / Self Care | Payer: BLUE CROSS/BLUE SHIELD | Attending: Emergency Medicine | Admitting: Emergency Medicine

## 2014-06-19 DIAGNOSIS — K8020 Calculus of gallbladder without cholecystitis without obstruction: Secondary | ICD-10-CM | POA: Insufficient documentation

## 2014-06-19 DIAGNOSIS — Z79899 Other long term (current) drug therapy: Secondary | ICD-10-CM | POA: Insufficient documentation

## 2014-06-19 DIAGNOSIS — R10811 Right upper quadrant abdominal tenderness: Secondary | ICD-10-CM

## 2014-06-19 DIAGNOSIS — Z3202 Encounter for pregnancy test, result negative: Secondary | ICD-10-CM | POA: Diagnosis not present

## 2014-06-19 DIAGNOSIS — R1011 Right upper quadrant pain: Secondary | ICD-10-CM | POA: Diagnosis present

## 2014-06-19 LAB — COMPREHENSIVE METABOLIC PANEL
ALT: 53 U/L — ABNORMAL HIGH (ref 0–35)
AST: 37 U/L (ref 0–37)
Albumin: 3.4 g/dL — ABNORMAL LOW (ref 3.5–5.2)
Alkaline Phosphatase: 123 U/L — ABNORMAL HIGH (ref 39–117)
Anion gap: 9 (ref 5–15)
BUN: <5 mg/dL — ABNORMAL LOW (ref 6–23)
CO2: 27 mmol/L (ref 19–32)
Calcium: 9 mg/dL (ref 8.4–10.5)
Chloride: 100 mmol/L (ref 96–112)
Creatinine, Ser: 0.67 mg/dL (ref 0.50–1.10)
GFR calc Af Amer: >90 mL/min (ref >90)
GLUCOSE: 90 mg/dL (ref 70–99)
POTASSIUM: 3.3 mmol/L — AB (ref 3.5–5.1)
SODIUM: 136 mmol/L (ref 135–145)
TOTAL PROTEIN: 7.4 g/dL (ref 6.0–8.3)
Total Bilirubin: 0.6 mg/dL (ref 0.3–1.2)

## 2014-06-19 LAB — CBC WITH DIFFERENTIAL/PLATELET
BASOS ABS: 0 10*3/uL (ref 0.0–0.1)
Basophils Relative: 1 % (ref 0–1)
Eosinophils Absolute: 0 10*3/uL (ref 0.0–0.7)
Eosinophils Relative: 1 % (ref 0–5)
HCT: 40.3 % (ref 36.0–46.0)
Hemoglobin: 13.7 g/dL (ref 12.0–15.0)
LYMPHS PCT: 48 % — AB (ref 12–46)
Lymphs Abs: 1.7 10*3/uL (ref 0.7–4.0)
MCH: 29.9 pg (ref 26.0–34.0)
MCHC: 34 g/dL (ref 30.0–36.0)
MCV: 88 fL (ref 78.0–100.0)
Monocytes Absolute: 0.4 10*3/uL (ref 0.1–1.0)
Monocytes Relative: 12 % (ref 3–12)
NEUTROS ABS: 1.3 10*3/uL — AB (ref 1.7–7.7)
Neutrophils Relative %: 38 % — ABNORMAL LOW (ref 43–77)
PLATELETS: 312 10*3/uL (ref 150–400)
RBC: 4.58 MIL/uL (ref 3.87–5.11)
RDW: 12.9 % (ref 11.5–15.5)
WBC: 3.5 10*3/uL — ABNORMAL LOW (ref 4.0–10.5)

## 2014-06-19 LAB — URINALYSIS, ROUTINE W REFLEX MICROSCOPIC
Bilirubin Urine: NEGATIVE
Glucose, UA: NEGATIVE mg/dL
Ketones, ur: NEGATIVE mg/dL
Leukocytes, UA: NEGATIVE
NITRITE: NEGATIVE
PROTEIN: NEGATIVE mg/dL
SPECIFIC GRAVITY, URINE: 1.016 (ref 1.005–1.030)
Urobilinogen, UA: 2 mg/dL — ABNORMAL HIGH (ref 0.0–1.0)
pH: 6.5 (ref 5.0–8.0)

## 2014-06-19 LAB — URINE MICROSCOPIC-ADD ON

## 2014-06-19 LAB — LIPASE, BLOOD: LIPASE: 20 U/L (ref 11–59)

## 2014-06-19 LAB — POC URINE PREG, ED: Preg Test, Ur: NEGATIVE

## 2014-06-19 MED ORDER — GI COCKTAIL ~~LOC~~
30.0000 mL | Freq: Once | ORAL | Status: AC
Start: 2014-06-19 — End: 2014-06-19
  Administered 2014-06-19: 30 mL via ORAL
  Filled 2014-06-19: qty 30

## 2014-06-19 MED ORDER — OMEPRAZOLE 20 MG PO CPDR: 20.0000 mg | DELAYED_RELEASE_CAPSULE | Freq: Every day | ORAL | Status: DC

## 2014-06-19 MED ORDER — PANTOPRAZOLE SODIUM 40 MG IV SOLR
40.0000 mg | Freq: Once | INTRAVENOUS | Status: AC
  Administered 2014-06-19: 40 mg via INTRAVENOUS
  Filled 2014-06-19: qty 40

## 2014-06-19 MED ORDER — SODIUM CHLORIDE 0.9 % IV BOLUS (SEPSIS)
1000.0000 mL | Freq: Once | INTRAVENOUS | Status: AC
  Administered 2014-06-19: 1000 mL via INTRAVENOUS

## 2014-06-19 NOTE — ED Provider Notes (Signed)
CSN: [id number]     Arrival date & time 06/19/14  1232 History   First MD Initiated Contact with Patient 06/19/14 1355     Chief Complaint  Patient presents with  . Abdominal Pain     (Consider location/radiation/quality/duration/timing/severity/associated sxs/prior Treatment) HPI  Beth Harrison is a 44 y.o. female with PMH of chlamydia, varicose veins, cesarean section 2 patient presenting with 1 month of epigastric abdominal pain that she described as burning and at times sharp that radiates to back as well as decreased appetite and early satiety. Patient states that at times food makes the pain worse. She is taken toms without relief. Patient did note one episode of nausea and vomiting earlier this week but none since. No changes in bowel or bladder. No hematochezia or hematemesis or melanotic stool. Patient history alcohol 1-2 drinks per week.     Past Medical History  Diagnosis Date  . Varicose vein   . Chlamydia     h/o positive   Past Surgical History  Procedure Laterality Date  . Cesarean section      x 2  . Breast reduction surgery      bilateral   Family History  Problem Relation Age of Onset  . Diabetes Sister   . Hypertension Mother   . Hypertension Brother   . Colon cancer Neg Hx   . Lung cancer Maternal Grandmother   . Breast cancer Paternal Grandmother 34   History  Substance Use Topics  . Smoking status: Never Smoker   . Smokeless tobacco: Never Used  . Alcohol Use: 0.5 - 1.0 oz/week    1-2 Standard drinks or equivalent per week     Comment: wine-occ   OB History    Gravida Para Term Preterm AB TAB SAB Ectopic Multiple Living   2 2        2      Review of Systems 10 Systems reviewed and are negative for acute change except as noted in the HPI.    Allergies  Ciprofloxacin  Home Medications   Prior to Admission medications   Medication Sig Start Date End Date Taking? Authorizing Provider  fluconazole (DIFLUCAN) 150 MG tablet Take 1 tablet  (150 mg total) by mouth once. Repeat in 72 hours 01/07/14   Megan Salon, MD  Norethin Ace-Eth Estrad-FE (MINASTRIN 24 FE) 1-20 MG-MCG(24) CHEW Chew 1 tablet by mouth daily. 04/01/14   Megan Salon, MD  terconazole (TERAZOL 7) 0.4 % vaginal cream Place 1 applicator vaginally at bedtime. Use externally as well up to twice daily. 01/07/14   Megan Salon, MD   BP 146/96 mmHg  Pulse 92  Temp(Src) 98.6 F (37 C)  Resp 21  Ht 5\' 5"  (1.651 m)  Wt 182 lb (82.555 kg)  BMI 30.29 kg/m2  SpO2 100%  LMP 06/17/2014 Physical Exam  Constitutional: She appears well-developed and well-nourished. No distress.  HENT:  Head: Normocephalic and atraumatic.  Dry mucous membranes.  Eyes: Conjunctivae and EOM are normal. Right eye exhibits no discharge. Left eye exhibits no discharge.  Cardiovascular: Normal rate and regular rhythm.   Pulmonary/Chest: Effort normal and breath sounds normal. No respiratory distress. She has no wheezes.  Abdominal: Soft. Bowel sounds are normal. She exhibits no distension.  Epigastric and right upper quadrant abdominal tenderness with negative Murphy's sign. No rigidity, guarding, rebound tenderness. No back tenderness or CVA tenderness.  Neurological: She is alert. She exhibits normal muscle tone. Coordination normal.  Skin: Skin is warm and dry.  She is not diaphoretic.  Nursing note and vitals reviewed.   ED Course  Procedures (including critical care time) Labs Review Labs Reviewed  COMPREHENSIVE METABOLIC PANEL - Abnormal; Notable for the following:    Potassium 3.3 (*)    BUN <5 (*)    Albumin 3.4 (*)    ALT 53 (*)    Alkaline Phosphatase 123 (*)    All other components within normal limits  CBC WITH DIFFERENTIAL/PLATELET - Abnormal; Notable for the following:    WBC 3.5 (*)    Neutrophils Relative % 38 (*)    Neutro Abs 1.3 (*)    Lymphocytes Relative 48 (*)    All other components within normal limits  URINALYSIS, ROUTINE W REFLEX MICROSCOPIC - Abnormal;  Notable for the following:    APPearance CLOUDY (*)    Hgb urine dipstick LARGE (*)    Urobilinogen, UA 2.0 (*)    All other components within normal limits  URINE MICROSCOPIC-ADD ON - Abnormal; Notable for the following:    Squamous Epithelial / LPF MANY (*)    All other components within normal limits  LIPASE, BLOOD  POC URINE PREG, ED    Imaging Review No results found.   EKG Interpretation None      MDM   Final diagnoses:  RUQ abdominal tenderness   Patient presenting with 1 month of intermittent right upper quadrant and epigastric abdominal tenderness as well as decreased appetite. No active nausea or vomiting or stool changes. VSS. No fevers. Patient with mild right upper quadrant abdominal tenderness and epigastric tenderness. No evidence of peritonitis. Lab workup significant for elevated alkaline phosphatase and mild elevation of ALT. No leukocytosis. Acute abdominal series without any evidence of obstruction. Patient's pain managed in the ED. Patient reports significant improvement with GI cocktail. Repeat abdominal exam with minimal abdominal tenderness. No evidence of peritonitis.   Pt signed out to [employer] at shift change. Plan: [place] RUQ to evaluate. If negative plan for discharge home with follow-up with PCP and GI with a PPI. If positive for cholelithiasis follow-up with surgery.   Discussed all results and patient verbalizes understanding and agrees with plan.  Case has been discussed with Dr. Leonides Schanz who agrees with the above plan and to discharge.     Al Corpus, PA-C 06/19/14 [place], DO 06/19/14 1651

## 2014-06-19 NOTE — ED Notes (Signed)
Tori, PA at the bedside.  

## 2014-06-19 NOTE — ED Notes (Signed)
shes had generalized abd pain, back pain, decreased appetite, early satiety x 1 month. She has had some nausea and vomited once this week. Denies bowel/bladder changes

## 2014-06-19 NOTE — ED Notes (Addendum)
Called [place] to see if patient can be seen. Patient has not been seen.

## 2014-06-19 NOTE — ED Notes (Signed)
Patient transported to X-ray 

## 2014-06-19 NOTE — Discharge Instructions (Signed)
Please follow the directions provided.  Be sure to follow-up with the surgery referral for further evaluation of the stones in your gall bladder. Take the omeprazole daily.  Don't hesitate to return for any new, worsening or concerning symptoms.     SEEK IMMEDIATE MEDICAL CARE IF:  Your pain increases and is not controlled by medicines.  You have a fever or persistent symptoms for more than 2-3 days.  You have a fever and your symptoms suddenly get worse.  You have persistent nausea and vomiting.

## 2014-06-19 NOTE — ED Provider Notes (Signed)
5:00 PM: At end of shift, hand-off report received from Doyce Loose, [place].  Plan includes review RUQ [place] results and dispo appropriately. If negative, d/c home with GI follow-up and PPI.   6:00 PM: [place] results reviewed shows cholelithiasis without cholecystitis.  Discussed findings with pt and family. She is pain free at time of discharge.  Pt is well-appearing, in no acute distress and vital signs reviewed and not concerning. She appears safe to be discharged.  Discharge include follow-up with surgery referral. Return precautions provided. Pt aware of plan and in agreement.   Filed Vitals:   06/19/14 1630 06/19/14 1700 06/19/14 1715 06/19/14 1830  BP: 147/96 144/94 135/94 132/86  Pulse: 85 79 94 81  Temp:    98.2 F (36.8 C)  TempSrc:    Oral  Resp: 19 14 15 13   Height:      Weight:      SpO2: 100% 100% 100% 100%   Meds given in ED:  Medications  gi cocktail (Maalox,Lidocaine,Donnatal) (30 mLs Oral Given 06/19/14 1430)  sodium chloride 0.9 % bolus 1,000 mL (0 mLs Intravenous Stopped 06/19/14 1716)  pantoprazole (PROTONIX) injection 40 mg (40 mg Intravenous Given 06/19/14 1637)    Discharge Medication List as of 06/19/2014  6:44 PM    START taking these medications   Details  omeprazole (PRILOSEC) 20 MG capsule Take 1 capsule (20 mg total) by mouth daily., Starting 06/19/2014, Until Discontinued, Print            Britt Bottom, NP 06/20/14 [id number]  Tanna Furry, MD 06/28/14 [phone]

## 2014-06-28 ENCOUNTER — Other Ambulatory Visit: Payer: Self-pay | Admitting: Gastroenterology

## 2014-06-28 ENCOUNTER — Ambulatory Visit (HOSPITAL_COMMUNITY)
Admission: RE | Admit: 2014-06-28 | Discharge: 2014-06-28 | Disposition: A | Discharge status: Home / Self Care | Payer: BLUE CROSS/BLUE SHIELD | Source: Ambulatory Visit | Attending: Gastroenterology | Admitting: Gastroenterology

## 2014-06-28 ENCOUNTER — Encounter (HOSPITAL_COMMUNITY): Payer: Self-pay

## 2014-06-28 DIAGNOSIS — R110 Nausea: Secondary | ICD-10-CM

## 2014-06-28 DIAGNOSIS — R599 Enlarged lymph nodes, unspecified: Secondary | ICD-10-CM | POA: Diagnosis not present

## 2014-06-28 DIAGNOSIS — R1013 Epigastric pain: Secondary | ICD-10-CM

## 2014-06-28 MED ORDER — IOHEXOL 300 MG/ML  SOLN
100.0000 mL | Freq: Once | INTRAMUSCULAR | Status: AC | PRN
  Administered 2014-06-28: 100 mL via INTRAVENOUS

## 2014-07-05 ENCOUNTER — Telehealth: Payer: Self-pay | Admitting: *Deleted

## 2014-07-05 ENCOUNTER — Telehealth: Payer: Self-pay | Admitting: Oncology

## 2014-07-05 DIAGNOSIS — C169 Malignant neoplasm of stomach, unspecified: Secondary | ICD-10-CM | POA: Insufficient documentation

## 2014-07-05 NOTE — Telephone Encounter (Signed)
NEW PATIENT APPT-LEFT MESSAGE FOR PATIENT AND GVE NP APPT FOR 05/11 @ 11:15 W/DR. SHERRILL

## 2014-07-05 NOTE — Telephone Encounter (Signed)
Called patient to confirm her appointment tomorrow with Dr. Benay Spice. She told nurse she already has seen another oncologist and will not be coming to [facility]. Wished her the best. Dr. Benson Norway notified.

## 2014-07-06 ENCOUNTER — Ambulatory Visit: Payer: BLUE CROSS/BLUE SHIELD

## 2014-07-06 ENCOUNTER — Ambulatory Visit: Payer: BLUE CROSS/BLUE SHIELD | Admitting: Oncology

## 2015-01-06 ENCOUNTER — Ambulatory Visit (INDEPENDENT_AMBULATORY_CARE_PROVIDER_SITE_OTHER): Payer: BLUE CROSS/BLUE SHIELD | Admitting: Obstetrics & Gynecology

## 2015-01-06 ENCOUNTER — Encounter: Payer: Self-pay | Admitting: Obstetrics & Gynecology

## 2015-01-06 VITALS — BP 128/84 | HR 64 | Resp 16 | Ht 64.5 in | Wt 150.0 lb

## 2015-01-06 DIAGNOSIS — R87619 Unspecified abnormal cytological findings in specimens from cervix uteri: Secondary | ICD-10-CM

## 2015-01-06 DIAGNOSIS — Z01419 Encounter for gynecological examination (general) (routine) without abnormal findings: Secondary | ICD-10-CM

## 2015-01-06 DIAGNOSIS — Z124 Encounter for screening for malignant neoplasm of cervix: Secondary | ICD-10-CM

## 2015-01-06 NOTE — Progress Notes (Signed)
44 y.o. G2P2 MarriedAfrican AmericanF here for annual exam.  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  She is on chemotherapy and sh  Pt is not cycling.  This started with chemotherapy.  She does not have hot flashes or night sweats.  Having labs for chemo next week.  Does have a port.   Pt reports having RLQ pain.  Pt feels this is constant now.  Initially this was intermittent but has progressed.  Pt did have CT scan 12/05/14 which showed no gyn issues.  Reviewed through care everywhere with pt.  Pt is having constipation with the narcotics.  She has had two bowel movements.    Hematologist/Oncologist:  Dr. Murlean Iba placed by Dr. Esperanza Heir.  Got infected on the right.  Had to put in on the left.  Is in her breast.  Worried about getting a mammogram.  Asks about having thermography.  I do not recommend this for breast cancer screening.     Patient's last menstrual period was 06/18/2014.          Sexually active: Yes.    The current method of family planning is none.    Exercising: No.  not regularly Smoker:  no  Health Maintenance: Pap:  04/08/11 WNL/negative HR HPV History of abnormal Pap:  Yes, 1995 normal biopsy, repeat pap-normal MMG:  05/24/14 3D-BiRads 1-negative Colonoscopy:  none BMD:   none TDaP:  Within the last 10 years Screening Labs: Hb today: recent, Urine today: recent   reports that she has never smoked. She has never used smokeless tobacco. She reports that she does not drink alcohol or use illicit drugs.  Past Medical History  Diagnosis Date  . Varicose vein   . Chlamydia     h/o positive  . Stomach cancer [facility])     diag 4/16-on 9th round of chemo at [facility]    Past Surgical History  Procedure Laterality Date  . Cesarean section      x 2  . Breast reduction surgery      bilateral  . Portacath placement      on right side, area got infected-moved to left side    Current Outpatient Prescriptions  Medication Sig Dispense Refill  .  b complex vitamins capsule Take 1 capsule by mouth.    . dexamethasone (DECADRON) 4 MG tablet Take 2 tablets (8mg ) on day 2 and 3 of chemotherapy.  Otherwise 2mg  daily.    . fentaNYL (DURAGESIC) 25 MCG/HR patch Place 1 patch onto the skin.    Marland Kitchen lisinopril (PRINIVIL,ZESTRIL) 5 MG tablet Take 10 mg by mouth.    . Multiple Vitamin (MULTI-VITAMINS) TABS Take 1 tablet by mouth.    . NON FORMULARY Mucositis mouthwash-viscous lidocaine 2% as needed    . oxyCODONE (ROXICODONE) 15 MG immediate release tablet Take 15 mg by mouth.    . pantoprazole (PROTONIX) 40 MG tablet Take 40 mg by mouth.    . potassium chloride SA (KLOR-CON M20) 20 MEQ tablet Take 20 mEq by mouth.    . prochlorperazine (COMPAZINE) 10 MG tablet Take 10 mg by mouth.    Marland Kitchen amoxicillin (AMOXIL) 500 MG tablet TAKE 4 TABLETS ONE HOUR BEFORE APPOINTMENT  1   No current facility-administered medications for this visit.    Family History  Problem Relation Age of Onset  . Diabetes Sister   . Hypertension Mother   . Hypertension Brother   . Colon cancer Neg Hx   . Lung cancer Maternal  Grandmother   . Breast cancer Paternal Grandmother 33    ROS:  Pertinent items are noted in HPI.  Otherwise, a comprehensive ROS was negative.  Exam:   General appearance: alert, cooperative and appears stated age Head: Normocephalic, without obvious abnormality, atraumatic Neck: no adenopathy, supple, symmetrical, trachea midline and thyroid normal to inspection and palpation Lungs: clear to auscultation bilaterally Breasts: normal appearance, no masses or tenderness but porta cath is in superior aspect of left upper breast.  Also three sutures still present where porta cath incision was located.  These were removed today. Heart: regular rate and rhythm Abdomen: soft, non-tender; bowel sounds normal; no masses,  no organomegaly Extremities: extremities normal, atraumatic, no cyanosis or edema Skin: Skin color, texture, turgor normal. No rashes or  lesions Lymph nodes: Cervical, supraclavicular, and axillary nodes normal. No abnormal inguinal nodes palpated Neurologic: Grossly normal  Pelvic: External genitalia:  no lesions              Urethra:  normal appearing urethra with no masses, tenderness or lesions              Bartholins and Skenes: normal                 Vagina: normal appearing vagina with normal color and discharge, no lesions              Cervix: no lesions              Pap taken: Yes.   Bimanual Exam:  Uterus:  normal size, contour, position, consistency, mobility, non-tender              Adnexa: normal adnexa and no mass, fullness, tenderness               Rectovaginal: Confirms               Anus:  normal sphincter tone, no lesions  Chaperone was present for exam.  A:  Well Woman with normal exam LLQ pain.  Neg CT for pelvic pathology 10/15.   Constipation Poorly differentiated adenocarcinoma of the stomach, Stage IV, followed/managed at [facility]:   Mammogram recommended.  Pt put in my own reminders as I will help schedule this for her in April, 2017.  May need diagnostic ultrasound on left breast due to placement of porta cath.  Pt in agreement with plan. pap smear with HR HPV obtained today. Lab order for [facility] to be done with labs next week.  She will have this done with her "chemo labs" As imaging with CT was negative for pelvic pathology about 1 month ago, feel pt should work on constipation.  Miralax daily encouraged with her colace and [place].  Pt will let me know if does not improve.  return annually or prn

## 2015-01-06 NOTE — Patient Instructions (Signed)
Miralax daily.  Can decrease if needed if making bowels too slow.

## 2015-01-11 LAB — IPS PAP TEST WITH HPV

## 2015-01-13 NOTE — Addendum Note (Signed)
Addended by: Megan Salon on: 01/13/2015 07:38 AM   Modules accepted: Orders, SmartSet

## 2015-01-16 ENCOUNTER — Telehealth: Payer: Self-pay | Admitting: Emergency Medicine

## 2015-01-16 NOTE — Telephone Encounter (Signed)
-----   Message from Megan Salon, MD sent at 01/13/2015  7:36 AM EST ----- Please inform pt her pap was abnormal.  Needs colposcopy and endometrial biopsy.  Jackson (which she had done with labs at [facility] was low indicating she is not fully in menopause.  Having biopsy will help with evaluation for this.  Pt has hx of poorly differentiated adenoca of the stomach, stage IV, diagnosed this past year.

## 2015-01-17 NOTE — Telephone Encounter (Signed)
Routing to provider for final review. Patient agreeable to disposition. Will close encounter.     

## 2015-01-17 NOTE — Telephone Encounter (Signed)
Reviewed benefit for colposcopy and endometrial biopsy with patient - she is already scheduled for 01/23/15. Patient understood and agreeable. Verified appointment date and time. Patient understood and said she may need to call us back regarding appointment based on appointment with her oncologist today. States she will call today or tomorrow. Patient aware of modified holiday hours this week. Routing to triage for review.

## 2015-01-17 NOTE — Telephone Encounter (Signed)
Notes Recorded by Huey Romans, RN on 01/16/2015 at 11:31 AM Call to patient. Brief explanation of pap results show abnormal cells and additional testing recommended. Endometrial biopsy and colpo explained to patient. She does not have cycles now due to cancer treatments. Appointment scheduled for 01-23-15 at 1000. Patient declined earlier appointment. Instructed to take Motrin 800 mg one hour prior with food. ------

## 2015-01-18 ENCOUNTER — Encounter: Payer: Self-pay | Admitting: Obstetrics & Gynecology

## 2015-01-18 ENCOUNTER — Telehealth: Payer: Self-pay | Admitting: Obstetrics & Gynecology

## 2015-01-18 NOTE — Telephone Encounter (Signed)
Patient called to cancel appointment for 01/23/2015 for endometrial biopsy. She wants to wait until she hears from her oncologist first.

## 2015-01-23 ENCOUNTER — Ambulatory Visit: Payer: BLUE CROSS/BLUE SHIELD | Admitting: Obstetrics & Gynecology

## 2015-01-30 ENCOUNTER — Emergency Department (HOSPITAL_COMMUNITY): Payer: BLUE CROSS/BLUE SHIELD

## 2015-01-30 ENCOUNTER — Encounter (HOSPITAL_COMMUNITY): Payer: Self-pay | Admitting: Emergency Medicine

## 2015-01-30 ENCOUNTER — Emergency Department (HOSPITAL_COMMUNITY)
Admission: EM | Admit: 2015-01-30 | Discharge: 2015-01-30 | Disposition: A | Discharge status: Home / Self Care | Payer: BLUE CROSS/BLUE SHIELD | Attending: Emergency Medicine | Admitting: Emergency Medicine

## 2015-01-30 DIAGNOSIS — R7401 Elevation of levels of liver transaminase levels: Secondary | ICD-10-CM

## 2015-01-30 DIAGNOSIS — R61 Generalized hyperhidrosis: Secondary | ICD-10-CM | POA: Diagnosis not present

## 2015-01-30 DIAGNOSIS — F419 Anxiety disorder, unspecified: Secondary | ICD-10-CM | POA: Insufficient documentation

## 2015-01-30 DIAGNOSIS — Z79899 Other long term (current) drug therapy: Secondary | ICD-10-CM | POA: Insufficient documentation

## 2015-01-30 DIAGNOSIS — Z840 Family history of diseases of the skin and subcutaneous tissue: Secondary | ICD-10-CM | POA: Diagnosis not present

## 2015-01-30 DIAGNOSIS — C786 Secondary malignant neoplasm of retroperitoneum and peritoneum: Secondary | ICD-10-CM | POA: Diagnosis not present

## 2015-01-30 DIAGNOSIS — C161 Malignant neoplasm of fundus of stomach: Secondary | ICD-10-CM | POA: Diagnosis not present

## 2015-01-30 DIAGNOSIS — Z452 Encounter for adjustment and management of vascular access device: Secondary | ICD-10-CM | POA: Insufficient documentation

## 2015-01-30 DIAGNOSIS — Z8679 Personal history of other diseases of the circulatory system: Secondary | ICD-10-CM | POA: Insufficient documentation

## 2015-01-30 DIAGNOSIS — K8020 Calculus of gallbladder without cholecystitis without obstruction: Secondary | ICD-10-CM | POA: Diagnosis not present

## 2015-01-30 DIAGNOSIS — R740 Nonspecific elevation of levels of transaminase and lactic acid dehydrogenase [LDH]: Secondary | ICD-10-CM | POA: Diagnosis not present

## 2015-01-30 DIAGNOSIS — R102 Pelvic and perineal pain: Secondary | ICD-10-CM | POA: Diagnosis not present

## 2015-01-30 DIAGNOSIS — Z3202 Encounter for pregnancy test, result negative: Secondary | ICD-10-CM | POA: Diagnosis not present

## 2015-01-30 DIAGNOSIS — Z8619 Personal history of other infectious and parasitic diseases: Secondary | ICD-10-CM | POA: Insufficient documentation

## 2015-01-30 DIAGNOSIS — R1031 Right lower quadrant pain: Secondary | ICD-10-CM | POA: Diagnosis present

## 2015-01-30 LAB — URINALYSIS, ROUTINE W REFLEX MICROSCOPIC
BILIRUBIN URINE: NEGATIVE
GLUCOSE, UA: NEGATIVE mg/dL
HGB URINE DIPSTICK: NEGATIVE
KETONES UR: NEGATIVE mg/dL
Leukocytes, UA: NEGATIVE
Nitrite: NEGATIVE
PH: 7.5 (ref 5.0–8.0)
Protein, ur: NEGATIVE mg/dL
Specific Gravity, Urine: 1.01 (ref 1.005–1.030)

## 2015-01-30 LAB — I-STAT BETA HCG BLOOD, ED (MC, WL, AP ONLY)

## 2015-01-30 LAB — CBC
HEMATOCRIT: 39.7 % (ref 36.0–46.0)
Hemoglobin: 12.4 g/dL (ref 12.0–15.0)
MCH: 29 pg (ref 26.0–34.0)
MCHC: 31.2 g/dL (ref 30.0–36.0)
MCV: 93 fL (ref 78.0–100.0)
Platelets: 287 10*3/uL (ref 150–400)
RBC: 4.27 MIL/uL (ref 3.87–5.11)
RDW: 15.3 % (ref 11.5–15.5)
WBC: 6 10*3/uL (ref 4.0–10.5)

## 2015-01-30 LAB — COMPREHENSIVE METABOLIC PANEL
ALK PHOS: 180 U/L — AB (ref 38–126)
ALT: 139 U/L — ABNORMAL HIGH (ref 14–54)
AST: 135 U/L — AB (ref 15–41)
Albumin: 3.5 g/dL (ref 3.5–5.0)
Anion gap: 6 (ref 5–15)
BILIRUBIN TOTAL: 0.4 mg/dL (ref 0.3–1.2)
BUN: 9 mg/dL (ref 6–20)
CALCIUM: 9.6 mg/dL (ref 8.9–10.3)
CO2: 28 mmol/L (ref 22–32)
CREATININE: 0.58 mg/dL (ref 0.44–1.00)
Chloride: 103 mmol/L (ref 101–111)
GFR calc Af Amer: >60 mL/min (ref >60)
Glucose, Bld: 96 mg/dL (ref 65–99)
Potassium: 3.4 mmol/L — ABNORMAL LOW (ref 3.5–5.1)
Sodium: 137 mmol/L (ref 135–145)
TOTAL PROTEIN: 7.2 g/dL (ref 6.5–8.1)

## 2015-01-30 LAB — LIPASE, BLOOD: Lipase: 25 U/L (ref 11–51)

## 2015-01-30 MED ORDER — KETOROLAC TROMETHAMINE 30 MG/ML IJ SOLN
30.0000 mg | Freq: Once | INTRAMUSCULAR | Status: AC
  Administered 2015-01-30: 30 mg via INTRAVENOUS
  Filled 2015-01-30: qty 1

## 2015-01-30 MED ORDER — IOHEXOL 350 MG/ML SOLN
100.0000 mL | Freq: Once | INTRAVENOUS | Status: AC | PRN
  Administered 2015-01-30: 100 mL via INTRAVENOUS

## 2015-01-30 MED ORDER — DICYCLOMINE HCL 20 MG PO TABS: 20.0000 mg | ORAL_TABLET | Freq: Four times a day (QID) | ORAL | Status: DC

## 2015-01-30 MED ORDER — DICYCLOMINE HCL 20 MG PO TABS: 20.0000 mg | ORAL_TABLET | Freq: Four times a day (QID) | ORAL | Status: AC

## 2015-01-30 MED ORDER — DICYCLOMINE HCL 10 MG PO CAPS
20.0000 mg | ORAL_CAPSULE | Freq: Once | ORAL | Status: AC
  Administered 2015-01-30: 20 mg via ORAL
  Filled 2015-01-30: qty 2

## 2015-01-30 NOTE — ED Notes (Signed)
Gave pt sandwich, per Dr. Posey Pronto, Informed Gabe - RN and Threasa Beards - RN.

## 2015-01-30 NOTE — Discharge Instructions (Signed)
Please follow up with Dr. [name] next week and discuss your pain management and liver function.  I have given you a prescription for a medication called Bentyl 20 mg. You can take one every 6 hours for your abdominal pain.  Your Ultrasound did not show acute infection or inflammation, but did show small gallstones. It may be possible that these stones can contribute to your symptoms. Please call [facility] for an appointment and further evaluation.  Cholelithiasis Cholelithiasis (also called gallstones) is a form of gallbladder disease in which gallstones form in your gallbladder. The gallbladder is an organ that stores bile made in the liver, which helps digest fats. Gallstones begin as small crystals and slowly grow into stones. Gallstone pain occurs when the gallbladder spasms and a gallstone is blocking the duct. Pain can also occur when a stone passes out of the duct.  RISK FACTORS  Being female.   Having multiple pregnancies. Health care providers sometimes advise removing diseased gallbladders before future pregnancies.   Being obese.  Eating a diet heavy in fried foods and fat.   Being older than 42 years and increasing age.   Prolonged use of medicines containing female hormones.   Having diabetes mellitus.   Rapidly losing weight.   Having a family history of gallstones (heredity).  SYMPTOMS  Nausea.   Vomiting.  Abdominal pain.   Yellowing of the skin (jaundice).   Sudden pain. It may persist from several minutes to several hours.  Fever.   Tenderness to the touch. In some cases, when gallstones do not move into the bile duct, people have no pain or symptoms. These are called "silent" gallstones.  TREATMENT Silent gallstones do not need treatment. In severe cases, emergency surgery may be required. Options for treatment include:  Surgery to remove the gallbladder. This is the most common treatment.  Medicines. These do not always  work and may take 6-12 months or more to work.  Shock wave treatment (extracorporeal biliary lithotripsy). In this treatment an ultrasound machine sends shock waves to the gallbladder to break gallstones into smaller pieces that can pass into the intestines or be dissolved by medicine. HOME CARE INSTRUCTIONS   Only take over-the-counter or prescription medicines for pain, discomfort, or fever as directed by your health care provider.   Follow a low-fat diet until seen again by your health care provider. Fat causes the gallbladder to contract, which can result in pain.   Follow up with your health care provider as directed. Attacks are almost always recurrent and surgery is usually required for permanent treatment.  SEEK IMMEDIATE MEDICAL CARE IF:   Your pain increases and is not controlled by medicines.   You have a fever or persistent symptoms for more than 2-3 days.   You have a fever and your symptoms suddenly get worse.   You have persistent nausea and vomiting.  MAKE SURE YOU:   Understand these instructions.  Will watch your condition.  Will get help right away if you are not doing well or get worse.   This information is not intended to replace advice given to you by your health care provider. Make sure you discuss any questions you have with your health care provider.   Document Released: 02/07/2005 Document Revised: 10/14/2012 Document Reviewed: 08/05/2012 Elsevier Interactive Patient Education [employer].

## 2015-01-30 NOTE — ED Notes (Addendum)
Pt c/o right lower quadrant pain ongoing for 3-4 weeks. Area tender to palpation. Pt reports having normal BM, last one today. Pt has history of gastric cancer and is on chemo. Pt had a CT done on Tuesday.

## 2015-01-30 NOTE — ED Provider Notes (Signed)
CSN: [id number]     Arrival date & time 01/30/15  0848 History   First MD Initiated Contact with Patient 01/30/15 [phone]     Chief Complaint  Patient presents with  . Abdominal Pain     (Consider location/radiation/quality/duration/timing/severity/associated sxs/prior Treatment) Patient is a 44 y.o. female presenting with abdominal pain. The history is provided by the patient.  Abdominal Pain Associated symptoms: no chest pain, no constipation, no cough, no diarrhea, no fever, no nausea, no shortness of breath and no vomiting   Ms. Jru Bellmore is a 44 year old female with PMH of Gastric Carcinoma (diagnosed in April, receiving chemo every 2 weeks) who presents with RLQ pain which initially began 3-4 weeks ago. Pain started at suprapubic region, but now mainly located at the right lower quadrant. It is sharp in nature, and constant with radiation down her right leg. Pain is worsened on urination. She has associated drenching sweats, but no fevers, chills, nausea, or vomiting. She has been taking Advil every 2-4 hours for her pain which provides some relief. She also says she has been given Morphine and Fentanyl patch which have not helped her pain. She does also report pleuritic pain with deep inspiration, but no typical chest pain symptoms, shortness of breath, or cough.  Past Medical History  Diagnosis Date  . Varicose vein   . Chlamydia     h/o positive  . Stomach cancer [facility])     diag 4/16-on 9th round of chemo at [facility]  . 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 Cesarean section      x 2  . Breast reduction surgery      bilateral  . Portacath placement      on right side, area got infected-moved to left side  . Breast lumpectomy  2006    right-fibroadenoma   Family History  Problem Relation Age of Onset  . Diabetes Sister   . Hypertension Mother   . Hypertension Brother   . Colon cancer Neg Hx   . Lung cancer Maternal Grandmother    . Breast cancer Paternal Grandmother 60   Social History  Substance Use Topics  . Smoking status: Never Smoker   . Smokeless tobacco: Never Used  . Alcohol Use: No   OB History    Gravida Para Term Preterm AB TAB SAB Ectopic Multiple Living   2 2        2      Review of Systems  Constitutional: Positive for diaphoresis. Negative for fever.  Respiratory: Negative for cough and shortness of breath.   Cardiovascular: Negative for chest pain and leg swelling.  Gastrointestinal: Positive for abdominal pain. Negative for nausea, vomiting, diarrhea and constipation.  Genitourinary: Positive for pelvic pain. Negative for frequency, flank pain and difficulty urinating.  Skin: Negative for rash.      Allergies  Ciprofloxacin  Home Medications   Prior to Admission medications   Medication Sig Start Date End Date Taking? Authorizing Provider  b complex vitamins tablet Take 1 tablet by mouth daily.   Yes Historical Provider, MD  cholecalciferol (VITAMIN D) 1000 UNITS tablet Take 1,000 Units by mouth daily.   Yes Historical Provider, MD  dexamethasone (DECADRON) 4 MG tablet Take 2 tablets (8mg ) on day 2 and 3 of chemotherapy.  Otherwise 2mg  daily. 11/30/14  Yes Historical Provider, MD  fentaNYL (DURAGESIC - DOSED MCG/HR) 50 MCG/HR Place 50 mcg onto the skin every 3 (  three) days.   Yes Historical Provider, MD  lisinopril (PRINIVIL,ZESTRIL) 5 MG tablet Take 5 mg by mouth daily.  11/30/14 11/30/15 Yes Historical Provider, MD  morphine (MSIR) 15 MG tablet Take 7.5 mg by mouth every 4 (four) hours as needed for moderate pain.  01/25/15  Yes Historical Provider, MD  Multiple Vitamin (MULTI-VITAMINS) TABS Take 1 tablet by mouth.   Yes Historical Provider, MD  NON FORMULARY Mucositis mouthwash-viscous lidocaine 2% as needed   Yes Historical Provider, MD  oxyCODONE (ROXICODONE) 15 MG immediate release tablet Take 15 mg by mouth. 12/27/14  Yes Historical Provider, MD  pantoprazole (PROTONIX) 40 MG  tablet Take 40 mg by mouth. 10/11/14  Yes Historical Provider, MD  potassium chloride SA (KLOR-CON M20) 20 MEQ tablet Take 20 mEq by mouth. 11/24/14  Yes Historical Provider, MD  thiamine (VITAMIN B-1) 100 MG tablet Take 100 mg by mouth daily.   Yes Historical Provider, MD  vitamin C (ASCORBIC ACID) 500 MG tablet Take 500 mg by mouth daily.   Yes Historical Provider, MD  zolpidem (AMBIEN) 10 MG tablet Take 5 mg by mouth at bedtime as needed for sleep.   Yes Historical Provider, MD  dicyclomine (BENTYL) 20 MG tablet Take 1 tablet (20 mg total) by mouth every 6 (six) hours. 01/30/15   Zada Finders, MD   BP 146/104 mmHg  Pulse 81  Temp(Src) 98.6 F (37 C) (Oral)  Resp 17  Ht 5' 5.5" (1.664 m)  Wt 69.4 kg  BMI 25.06 kg/m2  SpO2 99%  LMP 06/18/2014 Physical Exam  Constitutional: She is oriented to person, place, and time. She appears well-developed and well-nourished.  Appears anxious and in moderate distress.  Cardiovascular: Normal rate and regular rhythm.   No murmur heard. Pulmonary/Chest: No respiratory distress. She has no wheezes. She has no rales.  Decreased effort on deep inspiration.  Abdominal: Soft. She exhibits no distension. There is tenderness. There is no rebound and no guarding.  Musculoskeletal: She exhibits no edema.  Neurological: She is alert and oriented to person, place, and time.  Skin: Skin is warm.    ED Course  Procedures (including critical care time) Labs Review Labs Reviewed  COMPREHENSIVE METABOLIC PANEL - Abnormal; Notable for the following:    Potassium 3.4 (*)    AST 135 (*)    ALT 139 (*)    Alkaline Phosphatase 180 (*)    All other components within normal limits  LIPASE, BLOOD  CBC  URINALYSIS, ROUTINE W REFLEX MICROSCOPIC (NOT AT [facility])  I-STAT BETA HCG BLOOD, ED ([facility], [facility], [facility] ONLY)    Imaging Review Ct Angio Chest Pe W/cm &/or Wo Cm  01/30/2015  CLINICAL DATA:  Right-sided chest pain with shortness of breath, history of gastric carcinoma  EXAM: CT ANGIOGRAPHY CHEST WITH CONTRAST TECHNIQUE: Multidetector CT imaging of the chest was performed using the standard protocol during bolus administration of intravenous contrast. Multiplanar CT image reconstructions and MIPs were obtained to evaluate the vascular anatomy. CONTRAST:  158mL OMNIPAQUE IOHEXOL 350 MG/ML SOLN COMPARISON:  06/28/2014 FINDINGS: The lungs are well aerated bilaterally. Some dependent atelectatic changes are seen. Small right-sided pleural effusion is noted. No focal confluent infiltrate or pneumothorax is noted. No parenchymal nodules are seen. Thoracic inlet is within normal limits. A left chest wall port is seen. The thoracic aorta shows no significant dilatation or dissection. The pulmonary artery did shows no focal filling defect to suggest pulmonary embolism. No significant hilar or mediastinal adenopathy is noted. The visualized  upper abdomen is within normal limits. The known gastric mass is not well appreciated on this exam. No definitive rib abnormality is seen. The thoracic spine is unremarkable. Review of the MIP images confirms the above findings. IMPRESSION: No evidence of pulmonary emboli. Mild dependent atelectatic changes with small right pleural effusion. The changes on the right may represent some very early infiltrate. No other focal abnormality is seen. Electronically Signed   By: Inez Catalina M.D.   On: 01/30/2015 14:46   US Abdomen Limited Ruq  01/30/2015  CLINICAL DATA:  Transaminitis. Right upper quadrant pain for 2 weeks. EXAM: US ABDOMEN LIMITED - RIGHT UPPER QUADRANT COMPARISON:  CT 06/28/2014 FINDINGS: Gallbladder: Multiple small mobile gallstones, the largest 5 mm. No wall thickening. Negative sonographic Murphy's. Common bile duct: Diameter: Normal caliber, 2 mm Liver: No focal lesion identified. Within normal limits in parenchymal echogenicity. IMPRESSION: Cholelithiasis.  No sonographic evidence of acute cholecystitis. Electronically Signed   By:  Rolm Baptise M.D.   On: 01/30/2015 13:43   I have personally reviewed and evaluated these images and lab results as part of my medical decision-making.   EKG Interpretation None      MDM   Final diagnoses:  Transaminitis    44 y/o female with PMH of Gastric Carcinoma presenting with 3-4 weeks of RLQ abdominal pain. CT abdomen and pelvis on 12/1 showed findings concerning for progression of disease with interval enlargement of left iliac chain lymph nodes, increased gastric wall thickening, and evidence of peritoneal nodules. Imaging also showed a small right pleural effusion. Differential for abdominal pain include metastatic pain, ovarian torsion, PID, cholecystitis/choledocholelithiasis, or appendicitis. Patient recently saw OB/Gyn who did not believe pain was due to PID or torsion, and gradual onset of symptoms also makes ovarian torsion less likely. She does have a transaminitis warranting a RUQ U/S. For her pleuritic chest pain, patient's cancer is a risk factor for PE and CTA of the chest was done to rule it out. A small right pleural effusion was seen, but no evidence for PE.  RUQ U/S showed multiple small gallstones, but no common bile duct dilatation or evidence for acute cholecystitis. She has had elevations in her LFTs in the past that was thought to be secondary to chemo. Her chemo was stopped at that time with improvement in her liver function to normal levels. She will discuss this with her Oncologist, Dr. [name] who she will follow up with in 1 week. She may consider outpatient surgery follow up for symptomatic cholelithiasis if having continued or worsening symptoms that are not thought to be related to cancer pain, which is likely the cause for her current pain. She would like to avoid narcotics for pain at this time. Will give her 1 week supply of Bentyl. She has home Morphine already which she will use for severe pain.  Zada Finders, MD 01/30/15 1558  Gareth Morgan,  MD 01/30/15 2222

## 2015-02-22 ENCOUNTER — Telehealth: Payer: Self-pay | Admitting: Obstetrics & Gynecology

## 2015-02-22 NOTE — Telephone Encounter (Signed)
Patient has orders from a cancelled appointment in November for colposcopy and endometrial biopsy. Patient note states she preferred to speak with her oncologist prior to keeping appointment. Please advise if orders are still current or may close.

## 2015-02-22 NOTE — Telephone Encounter (Signed)
Dr. Sabra Heck,  Patient seen in office with RLQ pain. Pap obtained 01/06/15-AGUS.  Patient was scheduled for colposcopy and endometrial biopsy and subsequently cancelled states she wanted to speak with her oncologist first. She has since been seen in ER and diagnosed with gallstones and had appointment 02/21/15 with her oncologist at [facility] (reviewed in care everywhere).   Can you advise how to proceed?

## 2015-02-23 NOTE — Telephone Encounter (Signed)
Pt has adenocarcinoma of the stomach but needs this abnormal pap smear evaluated.  Can you call her again please?

## 2015-02-23 NOTE — Telephone Encounter (Signed)
Called patient and unable to leave message as voice mailbox is full.

## 2015-02-28 NOTE — Telephone Encounter (Signed)
Noted message from Dr. Miller. Will close encounter.   

## 2015-02-28 NOTE — Telephone Encounter (Signed)
Call to patient again and spoke with her.  She states that she discussed her abnormal pap smear with her oncologist when she had an office visit on 02/21/15. She has a CT scan this week and would like to wait until after CT scan is completed and she receives recommendations for care from her oncologist. Patient states she is aware of need for further evaluation and will call our office for update after CT scan and receives recommendations from Oncologist.  08 Recall entered for patient for follow up.  Update to Dr. Sabra Heck.

## 2015-02-28 NOTE — Telephone Encounter (Signed)
Thanks.  Ok to close encounter.  I have this in my reminders as well.

## 2015-03-20 ENCOUNTER — Telehealth: Payer: Self-pay | Admitting: *Deleted

## 2015-03-20 NOTE — Telephone Encounter (Signed)
Follow-up call to patient. Left message to call back. Can speak to Gay Filler or [place].  Calling to check on recommendations following her appointment with oncologist and recent CT scan. 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

## 2015-03-31 NOTE — Telephone Encounter (Signed)
Call to patient. Voice mail confirms "Beth Harrison." left message to call back.

## 2015-04-03 NOTE — Telephone Encounter (Signed)
Follow-up call to patient. Voice mail confirms "Beth Harrison." Left message to call back.

## 2015-04-04 NOTE — Telephone Encounter (Signed)
No response from patient. Per previous call with Olivia Mackie on 02-28-15, patient had agreed to call after her CT scan first week of January. Would you like a letter sent/addiditinal calls/or another type of follow up?

## 2015-04-06 ENCOUNTER — Encounter: Payer: Self-pay | Admitting: Obstetrics & Gynecology

## 2015-04-06 NOTE — Telephone Encounter (Signed)
Would you read the written letter and if you think it ok, we can sent it certified mail.  Thanks.

## 2015-04-06 NOTE — Telephone Encounter (Signed)
Letter from Dr Sabra Heck mailed certified and regular [place] mail. Encounter closed.

## 2015-04-13 ENCOUNTER — Inpatient Hospital Stay (HOSPITAL_COMMUNITY)
Admission: EM | Admit: 2015-04-13 | Discharge: 2015-04-26 | DRG: 871 | Disposition: E | Payer: BLUE CROSS/BLUE SHIELD | Attending: Internal Medicine | Admitting: Internal Medicine

## 2015-04-13 ENCOUNTER — Emergency Department (HOSPITAL_COMMUNITY): Payer: BLUE CROSS/BLUE SHIELD

## 2015-04-13 DIAGNOSIS — T451X5A Adverse effect of antineoplastic and immunosuppressive drugs, initial encounter: Secondary | ICD-10-CM | POA: Diagnosis present

## 2015-04-13 DIAGNOSIS — E876 Hypokalemia: Secondary | ICD-10-CM | POA: Diagnosis present

## 2015-04-13 DIAGNOSIS — J189 Pneumonia, unspecified organism: Secondary | ICD-10-CM | POA: Diagnosis present

## 2015-04-13 DIAGNOSIS — Z09 Encounter for follow-up examination after completed treatment for conditions other than malignant neoplasm: Secondary | ICD-10-CM

## 2015-04-13 DIAGNOSIS — C169 Malignant neoplasm of stomach, unspecified: Secondary | ICD-10-CM | POA: Diagnosis present

## 2015-04-13 DIAGNOSIS — J9601 Acute respiratory failure with hypoxia: Secondary | ICD-10-CM | POA: Diagnosis present

## 2015-04-13 DIAGNOSIS — J90 Pleural effusion, not elsewhere classified: Secondary | ICD-10-CM

## 2015-04-13 DIAGNOSIS — Z881 Allergy status to other antibiotic agents status: Secondary | ICD-10-CM

## 2015-04-13 DIAGNOSIS — J918 Pleural effusion in other conditions classified elsewhere: Secondary | ICD-10-CM

## 2015-04-13 DIAGNOSIS — Z803 Family history of malignant neoplasm of breast: Secondary | ICD-10-CM

## 2015-04-13 DIAGNOSIS — J910 Malignant pleural effusion: Secondary | ICD-10-CM | POA: Diagnosis present

## 2015-04-13 DIAGNOSIS — J9811 Atelectasis: Secondary | ICD-10-CM | POA: Diagnosis present

## 2015-04-13 DIAGNOSIS — Z79899 Other long term (current) drug therapy: Secondary | ICD-10-CM

## 2015-04-13 DIAGNOSIS — Z8249 Family history of ischemic heart disease and other diseases of the circulatory system: Secondary | ICD-10-CM

## 2015-04-13 DIAGNOSIS — J869 Pyothorax without fistula: Secondary | ICD-10-CM | POA: Diagnosis present

## 2015-04-13 DIAGNOSIS — I469 Cardiac arrest, cause unspecified: Secondary | ICD-10-CM | POA: Diagnosis not present

## 2015-04-13 DIAGNOSIS — A419 Sepsis, unspecified organism: Secondary | ICD-10-CM | POA: Diagnosis not present

## 2015-04-13 DIAGNOSIS — R0602 Shortness of breath: Secondary | ICD-10-CM

## 2015-04-13 DIAGNOSIS — Z833 Family history of diabetes mellitus: Secondary | ICD-10-CM

## 2015-04-13 DIAGNOSIS — I10 Essential (primary) hypertension: Secondary | ICD-10-CM | POA: Diagnosis present

## 2015-04-13 DIAGNOSIS — R531 Weakness: Secondary | ICD-10-CM | POA: Diagnosis not present

## 2015-04-13 DIAGNOSIS — E871 Hypo-osmolality and hyponatremia: Secondary | ICD-10-CM | POA: Diagnosis present

## 2015-04-13 DIAGNOSIS — Y95 Nosocomial condition: Secondary | ICD-10-CM | POA: Diagnosis present

## 2015-04-13 DIAGNOSIS — I959 Hypotension, unspecified: Secondary | ICD-10-CM | POA: Diagnosis present

## 2015-04-13 DIAGNOSIS — Z801 Family history of malignant neoplasm of trachea, bronchus and lung: Secondary | ICD-10-CM

## 2015-04-13 DIAGNOSIS — G620 Drug-induced polyneuropathy: Secondary | ICD-10-CM | POA: Diagnosis present

## 2015-04-13 MED ORDER — PIPERACILLIN-TAZOBACTAM 3.375 G IVPB 30 MIN
3.3750 g | Freq: Once | INTRAVENOUS | Status: AC
  Administered 2015-04-14: 3.375 g via INTRAVENOUS
  Filled 2015-04-13: qty 50

## 2015-04-13 MED ORDER — SODIUM CHLORIDE 0.9 % IV BOLUS (SEPSIS)
1000.0000 mL | INTRAVENOUS | Status: AC
  Administered 2015-04-14: 1000 mL via INTRAVENOUS

## 2015-04-13 MED ORDER — VANCOMYCIN HCL IN DEXTROSE 1-5 GM/200ML-% IV SOLN
1000.0000 mg | Freq: Once | INTRAVENOUS | Status: AC
  Administered 2015-04-14: 1000 mg via INTRAVENOUS
  Filled 2015-04-13: qty 200

## 2015-04-13 MED ORDER — SODIUM CHLORIDE 0.9 % IV BOLUS (SEPSIS)
500.0000 mL | INTRAVENOUS | Status: AC
Start: 2015-04-14 — End: 2015-04-14

## 2015-04-13 NOTE — ED Notes (Signed)
Bed: [id number] Expected date:  Expected time:  Means of arrival:  Comments: EMS weak and hypotensive

## 2015-04-13 NOTE — ED Notes (Signed)
Pt on cardiac monitor.

## 2015-04-13 NOTE — ED Provider Notes (Signed)
CSN: [id number]     Arrival date & time 04/05/2015  2312 History  By signing my name below, I, Nicole Kindred, attest that this documentation has been prepared under the direction and in the presence of Orpah Greek, MD.   Electronically Signed: Nicole Kindred, ED Scribe. 04/12/2015. 12:09 PM     Chief Complaint  Patient presents with  . Hypotension    The history is provided by the patient. No language interpreter was used.   HPI Comments: Beth Harrison is a 45 y.o. female with PMHx of stomach cancer who presents to the Emergency Department via EMS complaining of gradual onset, constant weakness, onset earlier tonight. Pt reports associated SOB, mild pain with breathing, and hypotension. Pt was diagnosed with pneumonia one month and was treated. Symptoms returned and she was rediagnosed two days ago and has begun antibiotics. Pt reports that she is not using oxygen at home. No other worsening or alleviating factors noted. Pt denies vomiting, nausea, syncope, or any other pertinent symptoms.   Past Medical History  Diagnosis Date  . Varicose vein   . Chlamydia     h/o positive  . Stomach cancer [facility])     diag 4/16-on 9th round of chemo at [facility]  . 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 Cesarean section      x 2  . Breast reduction surgery      bilateral  . Portacath placement      on right side, area got infected-moved to left side  . Breast lumpectomy  2006    right-fibroadenoma   Family History  Problem Relation Age of Onset  . Diabetes Sister   . Hypertension Mother   . Hypertension Brother   . Colon cancer Neg Hx   . Lung cancer Maternal Grandmother   . Breast cancer Paternal Grandmother 57   Social History  Substance Use Topics  . Smoking status: Never Smoker   . Smokeless tobacco: Never Used  . Alcohol Use: No   OB History    Gravida Para Term Preterm AB TAB SAB Ectopic Multiple Living   2 2        2       Review of Systems  Respiratory: Positive for shortness of breath.   Gastrointestinal: Negative for nausea and vomiting.  Neurological: Positive for weakness. Negative for syncope.  All other systems reviewed and are negative.     Allergies  Ciprofloxacin  Home Medications   Prior to Admission medications   Medication Sig Start Date End Date Taking? Authorizing Provider  b complex vitamins tablet Take 1 tablet by mouth daily.    Historical Provider, MD  cholecalciferol (VITAMIN D) 1000 UNITS tablet Take 1,000 Units by mouth daily.    Historical Provider, MD  dexamethasone (DECADRON) 4 MG tablet Take 2 tablets (8mg ) on day 2 and 3 of chemotherapy.  Otherwise 2mg  daily. 11/30/14   Historical Provider, MD  dicyclomine (BENTYL) 20 MG tablet Take 1 tablet (20 mg total) by mouth every 6 (six) hours. 01/30/15   Zada Finders, MD  fentaNYL (DURAGESIC - DOSED MCG/HR) 50 MCG/HR Place 50 mcg onto the skin every 3 (three) days.    Historical Provider, MD  lisinopril (PRINIVIL,ZESTRIL) 5 MG tablet Take 5 mg by mouth daily.  11/30/14 11/30/15  Historical Provider, MD  morphine (MSIR) 15 MG tablet Take 7.5 mg by mouth every 4 (four) hours as needed for moderate pain.  01/25/15  Historical Provider, MD  Multiple Vitamin (MULTI-VITAMINS) TABS Take 1 tablet by mouth.    Historical Provider, MD  NON FORMULARY Mucositis mouthwash-viscous lidocaine 2% as needed    Historical Provider, MD  oxyCODONE (ROXICODONE) 15 MG immediate release tablet Take 15 mg by mouth. 12/27/14   Historical Provider, MD  pantoprazole (PROTONIX) 40 MG tablet Take 40 mg by mouth. 10/11/14   Historical Provider, MD  potassium chloride SA (KLOR-CON M20) 20 MEQ tablet Take 20 mEq by mouth. 11/24/14   Historical Provider, MD  thiamine (VITAMIN B-1) 100 MG tablet Take 100 mg by mouth daily.    Historical Provider, MD  vitamin C (ASCORBIC ACID) 500 MG tablet Take 500 mg by mouth daily.    Historical Provider, MD  zolpidem (AMBIEN) 10 MG  tablet Take 5 mg by mouth at bedtime as needed for sleep.    Historical Provider, MD   BP 93/67 mmHg  Pulse 124  Temp(Src) 101.2 F (38.4 C) (Rectal)  Resp 26  Ht 5' 5.5" (1.664 m)  Wt 144 lb (65.318 kg)  BMI 23.59 kg/m2  SpO2 100% Physical Exam  Constitutional: She is oriented to person, place, and time. She appears well-developed and well-nourished. No distress.  HENT:  Head: Normocephalic and atraumatic.  Right Ear: Hearing normal.  Left Ear: Hearing normal.  Nose: Nose normal.  Mouth/Throat: Oropharynx is clear and moist and mucous membranes are normal.  Eyes: Conjunctivae and EOM are normal. Pupils are equal, round, and reactive to light.  Neck: Normal range of motion. Neck supple.  Cardiovascular: Regular rhythm, S1 normal and S2 normal.  Tachycardia present.  Exam reveals no gallop and no friction rub.   No murmur heard. Pulmonary/Chest: Tachypnea noted. She is in respiratory distress. She has decreased breath sounds. She exhibits no tenderness.  Breath sounds severely diminished throughout.   Abdominal: Soft. Normal appearance and bowel sounds are normal. There is no hepatosplenomegaly. There is no tenderness. There is no rebound, no guarding, no tenderness at McBurney's point and negative Murphy's sign. No hernia.  Musculoskeletal: Normal range of motion.  Neurological: She is alert and oriented to person, place, and time. She has normal strength. No cranial nerve deficit or sensory deficit. Coordination normal. GCS eye subscore is 4. GCS verbal subscore is 5. GCS motor subscore is 6.  Skin: Skin is warm, dry and intact. No rash noted. No cyanosis.  Psychiatric: She has a normal mood and affect. Her speech is normal and behavior is normal. Thought content normal.  Nursing note and vitals reviewed.   ED Course  Procedures (including critical care time)  DIAGNOSTIC STUDIES: Oxygen Saturation is 91% on RA, low by my interpretation.    COORDINATION OF CARE: 11:28  PM-Discussed treatment plan which includes EKG, CMP, CBC with differential, urine culture, urinalysis,and CXR  with pt at bedside and pt agreed to plan.     Labs Review Labs Reviewed  COMPREHENSIVE METABOLIC PANEL - Abnormal; Notable for the following:    Sodium 130 (*)    Potassium 2.4 (*)    Chloride 92 (*)    Glucose, Bld 123 (*)    Calcium 7.5 (*)    Total Protein 5.8 (*)    Albumin 2.2 (*)    ALT 8 (*)    All other components within normal limits  CBC WITH DIFFERENTIAL/PLATELET - Abnormal; Notable for the following:    RBC 3.61 (*)    Hemoglobin 9.7 (*)    HCT 30.7 (*)    RDW 16.3 (*)  All other components within normal limits  URINALYSIS, ROUTINE W REFLEX MICROSCOPIC (NOT AT [facility]) - Abnormal; Notable for the following:    APPearance CLOUDY (*)    All other components within normal limits  I-STAT CG4 LACTIC ACID, ED - Abnormal; Notable for the following:    Lactic Acid, Venous 2.44 (*)    All other components within normal limits  I-STAT CHEM 8, ED - Abnormal; Notable for the following:    Sodium 131 (*)    Potassium 2.4 (*)    Chloride 90 (*)    BUN 5 (*)    Glucose, Bld 118 (*)    Calcium, Ion 0.95 (*)    Hemoglobin 11.2 (*)    HCT 33.0 (*)    All other components within normal limits  CULTURE, BLOOD (ROUTINE X 2)  CULTURE, BLOOD (ROUTINE X 2)  URINE CULTURE  I-STAT CG4 LACTIC ACID, ED    Imaging Review  Ct Angio Chest Pe W/cm &/or Wo Cm  04/14/2015  CLINICAL DATA:  Weakness, shortness of breath, hypotension. Recent treatment for pneumonia. EXAM: CT ANGIOGRAPHY CHEST WITH CONTRAST TECHNIQUE: Multidetector CT imaging of the chest was performed using the standard protocol during bolus administration of intravenous contrast. Multiplanar CT image reconstructions and MIPs were obtained to evaluate the vascular anatomy. CONTRAST:  142mL OMNIPAQUE IOHEXOL 350 MG/ML SOLN COMPARISON:  01/30/2015 FINDINGS: Technically adequate study with good opacification of the central  and segmental pulmonary arteries. No focal filling defects demonstrated. No evidence of significant pulmonary embolus. Normal heart size. Normal caliber thoracic aorta. Great vessel origins are patent. Left central venous catheter with tip in the SVC. Esophagus is decompressed. No significant lymphadenopathy in the chest. There are moderate size bilateral pleural effusions with atelectasis in the lung bases bilaterally. This is progressing since previous study. No pneumothorax. Included portions of the upper abdominal organs are grossly unremarkable. No destructive bone lesions. Review of the MIP images confirms the above findings. IMPRESSION: No evidence of significant pulmonary embolus. Moderate-sized enlarging bilateral pleural effusions with basilar atelectasis or consolidation. Electronically Signed   By: [name] M.D.   On: 04/14/2015 02:07   Dg Chest Port 1 View  04/01/2015  CLINICAL DATA:  Gradual onset weakness since earlier tonight. Shortness of breath. Pain with breathing. Hypotension. History of stomach cancer. Diagnosed with pneumonia a month ago. EXAM: PORTABLE CHEST 1 VIEW COMPARISON:  02/15/2015 FINDINGS: Power port type central venous catheter with tip over the mid SVC region. No pneumothorax. Shallow inspiration. Heart size and pulmonary vascularity are normal for technique. Small bilateral pleural effusions with basilar atelectasis or consolidation bilaterally. Changes are greater on the right. Changes are new since previous study. IMPRESSION: Bilateral pleural effusions with basilar atelectasis or infiltration, greater on the right. Electronically Signed   By: [name] M.D.   On: 04/05/2015 23:59       EKG Interpretation   Date/Time:  Thursday April 13 2015 23:19:38 EST Ventricular Rate:  129 PR Interval:  114 QRS Duration: 77 QT Interval:  284 QTC Calculation: 416 R Axis:   117 Text Interpretation:  Sinus tachycardia Probable anterolateral infarct,  age  indeterm Confirmed by Pierre Dellarocco  MD, Makensie Mulhall [phone]) on 04/14/2015  12:11:45 AM      MDM   Final diagnoses:  Sepsis, due to unspecified organism ([place])  Pleural effusion  Hypokalemia   Patient with history of stomach cancer presents to the emergency department with complaints of severe weakness. EMS had identified severe hypotension, initiated fluid bolus. Patient  was profoundly hypotensive initially. Upon initial evaluation, however, she was found to be severely dyspneic and hypoxic. Room air oxygen saturation was 67% initially. This improved with supplemental oxygen.  Lab work does not reveal any evidence of neutropenia. She does have profound hypokalemia, initiated on IV replacement.  Chest x-ray shows bilateral effusions with a large effusion on the right. Patient was found to be febrile. With hypotension, tachycardia and fever, sepsis was suspected and patient was initiated on sepsis protocol including empiric antibiotic coverage of Zosyn and vancomycin and 30 mL/kg IV fluid bolus. After her fluid bolus, blood pressure is improved to [id number] systolic. Initial lactic acid was 2.44.  CT angiography was performed to further evaluate her lungs. No PE is noted. Large pleural effusions were noted, no obvious pneumonia present, but suspect that the source of fever will be intrathoracic. She is still tachypneic and dyspneic and if removed from oxygen, immediately desaturates significantly. She will require thoracentesis to rule out infection in her pleural effusion as well as for therapeutic purposes.  Discussed with Dr. Oletta Darter, critical care. Patient will be seen in ER.  Sepsis - Repeat Assessment  Performed at:    02:30  Vitals     Blood pressure 93/67, pulse 124, temperature 101.2 F (38.4 C), temperature source Rectal, resp. rate 26, height 5' 5.5" (1.664 m), weight 144 lb (65.318 kg), SpO2 100 %.  Heart:     Tachycardic  Lungs:    decreases at bases, tachypneic  Capillary Refill:   <2  sec  Peripheral Pulse:   Radial pulse palpable  Skin:     Normal Color  CRITICAL CARE Performed by: Orpah Greek.   Total critical care time: 30 minutes  Critical care time was exclusive of separately billable procedures and treating other patients.  Critical care was necessary to treat or prevent imminent or life-threatening deterioration.  Critical care was time spent personally by me on the following activities: development of treatment plan with patient and/or surrogate as well as nursing, discussions with consultants, evaluation of patient's response to treatment, examination of patient, obtaining history from patient or surrogate, ordering and performing treatments and interventions, ordering and review of laboratory studies, ordering and review of radiographic studies, pulse oximetry and re-evaluation of patient's condition.   I personally performed the services described in this documentation, which was scribed in my presence. The recorded information has been reviewed and is accurate.    Orpah Greek, MD 04/14/15 [phone]

## 2015-04-13 NOTE — ED Notes (Signed)
EKG given to EDP,Pollina,MD., for review. 

## 2015-04-13 NOTE — ED Notes (Signed)
Pt requested to not have port accessed

## 2015-04-14 ENCOUNTER — Emergency Department (HOSPITAL_COMMUNITY): Payer: BLUE CROSS/BLUE SHIELD

## 2015-04-14 ENCOUNTER — Encounter (HOSPITAL_COMMUNITY): Payer: Self-pay | Admitting: Emergency Medicine

## 2015-04-14 ENCOUNTER — Inpatient Hospital Stay (HOSPITAL_COMMUNITY): Payer: BLUE CROSS/BLUE SHIELD

## 2015-04-14 DIAGNOSIS — R0902 Hypoxemia: Secondary | ICD-10-CM

## 2015-04-14 DIAGNOSIS — I959 Hypotension, unspecified: Secondary | ICD-10-CM | POA: Diagnosis present

## 2015-04-14 DIAGNOSIS — J918 Pleural effusion in other conditions classified elsewhere: Secondary | ICD-10-CM

## 2015-04-14 DIAGNOSIS — E871 Hypo-osmolality and hyponatremia: Secondary | ICD-10-CM | POA: Diagnosis not present

## 2015-04-14 DIAGNOSIS — T451X5A Adverse effect of antineoplastic and immunosuppressive drugs, initial encounter: Secondary | ICD-10-CM | POA: Diagnosis present

## 2015-04-14 DIAGNOSIS — J90 Pleural effusion, not elsewhere classified: Secondary | ICD-10-CM | POA: Diagnosis not present

## 2015-04-14 DIAGNOSIS — Z833 Family history of diabetes mellitus: Secondary | ICD-10-CM | POA: Diagnosis not present

## 2015-04-14 DIAGNOSIS — E876 Hypokalemia: Secondary | ICD-10-CM | POA: Diagnosis not present

## 2015-04-14 DIAGNOSIS — J910 Malignant pleural effusion: Secondary | ICD-10-CM | POA: Diagnosis present

## 2015-04-14 DIAGNOSIS — J189 Pneumonia, unspecified organism: Secondary | ICD-10-CM | POA: Diagnosis present

## 2015-04-14 DIAGNOSIS — J9601 Acute respiratory failure with hypoxia: Secondary | ICD-10-CM | POA: Diagnosis not present

## 2015-04-14 DIAGNOSIS — A419 Sepsis, unspecified organism: Secondary | ICD-10-CM | POA: Diagnosis present

## 2015-04-14 DIAGNOSIS — C161 Malignant neoplasm of fundus of stomach: Secondary | ICD-10-CM | POA: Diagnosis not present

## 2015-04-14 DIAGNOSIS — R531 Weakness: Secondary | ICD-10-CM | POA: Diagnosis present

## 2015-04-14 DIAGNOSIS — J9811 Atelectasis: Secondary | ICD-10-CM | POA: Diagnosis present

## 2015-04-14 DIAGNOSIS — J869 Pyothorax without fistula: Secondary | ICD-10-CM | POA: Diagnosis present

## 2015-04-14 DIAGNOSIS — R0602 Shortness of breath: Secondary | ICD-10-CM

## 2015-04-14 DIAGNOSIS — Y95 Nosocomial condition: Secondary | ICD-10-CM | POA: Diagnosis present

## 2015-04-14 DIAGNOSIS — G620 Drug-induced polyneuropathy: Secondary | ICD-10-CM | POA: Diagnosis present

## 2015-04-14 DIAGNOSIS — J948 Other specified pleural conditions: Secondary | ICD-10-CM | POA: Diagnosis not present

## 2015-04-14 DIAGNOSIS — Z803 Family history of malignant neoplasm of breast: Secondary | ICD-10-CM | POA: Diagnosis not present

## 2015-04-14 DIAGNOSIS — Z881 Allergy status to other antibiotic agents status: Secondary | ICD-10-CM | POA: Diagnosis not present

## 2015-04-14 DIAGNOSIS — I469 Cardiac arrest, cause unspecified: Secondary | ICD-10-CM | POA: Diagnosis not present

## 2015-04-14 DIAGNOSIS — Z801 Family history of malignant neoplasm of trachea, bronchus and lung: Secondary | ICD-10-CM | POA: Diagnosis not present

## 2015-04-14 DIAGNOSIS — I10 Essential (primary) hypertension: Secondary | ICD-10-CM | POA: Diagnosis present

## 2015-04-14 DIAGNOSIS — C160 Malignant neoplasm of cardia: Secondary | ICD-10-CM

## 2015-04-14 DIAGNOSIS — Z79899 Other long term (current) drug therapy: Secondary | ICD-10-CM | POA: Diagnosis not present

## 2015-04-14 DIAGNOSIS — Z8249 Family history of ischemic heart disease and other diseases of the circulatory system: Secondary | ICD-10-CM | POA: Diagnosis not present

## 2015-04-14 DIAGNOSIS — C169 Malignant neoplasm of stomach, unspecified: Secondary | ICD-10-CM | POA: Diagnosis present

## 2015-04-14 LAB — GRAM STAIN

## 2015-04-14 LAB — COMPREHENSIVE METABOLIC PANEL
ALBUMIN: 2.2 g/dL — AB (ref 3.5–5.0)
ALK PHOS: 86 U/L (ref 38–126)
ALT: 8 U/L — AB (ref 14–54)
AST: 16 U/L (ref 15–41)
Anion gap: 13 (ref 5–15)
BILIRUBIN TOTAL: 0.7 mg/dL (ref 0.3–1.2)
BUN: 7 mg/dL (ref 6–20)
CALCIUM: 7.5 mg/dL — AB (ref 8.9–10.3)
CO2: 25 mmol/L (ref 22–32)
CREATININE: 0.96 mg/dL (ref 0.44–1.00)
Chloride: 92 mmol/L — ABNORMAL LOW (ref 101–111)
GFR calc Af Amer: >60 mL/min (ref >60)
GLUCOSE: 123 mg/dL — AB (ref 65–99)
POTASSIUM: 2.4 mmol/L — AB (ref 3.5–5.1)
Sodium: 130 mmol/L — ABNORMAL LOW (ref 135–145)
TOTAL PROTEIN: 5.8 g/dL — AB (ref 6.5–8.1)

## 2015-04-14 LAB — I-STAT CHEM 8, ED
BUN: 5 mg/dL — ABNORMAL LOW (ref 6–20)
CHLORIDE: 90 mmol/L — AB (ref 101–111)
Calcium, Ion: 0.95 mmol/L — ABNORMAL LOW (ref 1.12–1.23)
Creatinine, Ser: 0.8 mg/dL (ref 0.44–1.00)
Glucose, Bld: 118 mg/dL — ABNORMAL HIGH (ref 65–99)
HEMATOCRIT: 33 % — AB (ref 36.0–46.0)
HEMOGLOBIN: 11.2 g/dL — AB (ref 12.0–15.0)
POTASSIUM: 2.4 mmol/L — AB (ref 3.5–5.1)
Sodium: 131 mmol/L — ABNORMAL LOW (ref 135–145)
TCO2: 27 mmol/L (ref 0–100)

## 2015-04-14 LAB — PROTIME-INR
INR: 1.29 (ref 0.00–1.49)
Prothrombin Time: 16.2 seconds — ABNORMAL HIGH (ref 11.6–15.2)

## 2015-04-14 LAB — CBC WITH DIFFERENTIAL/PLATELET
BASOS ABS: 0 10*3/uL (ref 0.0–0.1)
BASOS PCT: 0 %
EOS ABS: 0 10*3/uL (ref 0.0–0.7)
EOS PCT: 0 %
HCT: 30.7 % — ABNORMAL LOW (ref 36.0–46.0)
Hemoglobin: 9.7 g/dL — ABNORMAL LOW (ref 12.0–15.0)
LYMPHS PCT: 12 %
Lymphs Abs: 0.9 10*3/uL (ref 0.7–4.0)
MCH: 26.9 pg (ref 26.0–34.0)
MCHC: 31.6 g/dL (ref 30.0–36.0)
MCV: 85 fL (ref 78.0–100.0)
MONO ABS: 0.7 10*3/uL (ref 0.1–1.0)
Monocytes Relative: 9 %
Neutro Abs: 5.5 10*3/uL (ref 1.7–7.7)
Neutrophils Relative %: 79 %
PLATELETS: 345 10*3/uL (ref 150–400)
RBC: 3.61 MIL/uL — AB (ref 3.87–5.11)
RDW: 16.3 % — AB (ref 11.5–15.5)
WBC: 7.1 10*3/uL (ref 4.0–10.5)

## 2015-04-14 LAB — BLOOD GAS, ARTERIAL
ACID-BASE EXCESS: 1.3 mmol/L (ref 0.0–2.0)
BICARBONATE: 22.7 meq/L (ref 20.0–24.0)
DRAWN BY: 235321
O2 Content: 4 L/min
O2 SAT: 97.8 %
PATIENT TEMPERATURE: 101.2
PO2 ART: 110 mmHg — AB (ref 80.0–100.0)
TCO2: 20.4 mmol/L (ref 0–100)
pCO2 arterial: 27.9 mmHg — ABNORMAL LOW (ref 35.0–45.0)
pH, Arterial: 7.527 — ABNORMAL HIGH (ref 7.350–7.450)

## 2015-04-14 LAB — BODY FLUID CELL COUNT WITH DIFFERENTIAL
Lymphs, Fluid: 30 %
Monocyte-Macrophage-Serous Fluid: 56 % (ref 50–90)
NEUTROPHIL FLUID: 14 % (ref 0–25)
Total Nucleated Cell Count, Fluid: 845 cu mm (ref 0–1000)

## 2015-04-14 LAB — LACTATE DEHYDROGENASE, PLEURAL OR PERITONEAL FLUID: LD FL: 1046 U/L — AB (ref 3–23)

## 2015-04-14 LAB — URINALYSIS, ROUTINE W REFLEX MICROSCOPIC
Bilirubin Urine: NEGATIVE
Glucose, UA: NEGATIVE mg/dL
Hgb urine dipstick: NEGATIVE
KETONES UR: NEGATIVE mg/dL
LEUKOCYTES UA: NEGATIVE
NITRITE: NEGATIVE
PH: 7.5 (ref 5.0–8.0)
PROTEIN: NEGATIVE mg/dL
Specific Gravity, Urine: 1.01 (ref 1.005–1.030)

## 2015-04-14 LAB — CORTISOL: CORTISOL PLASMA: 21.7 ug/dL

## 2015-04-14 LAB — MAGNESIUM: MAGNESIUM: 1.4 mg/dL — AB (ref 1.7–2.4)

## 2015-04-14 LAB — LACTIC ACID, PLASMA
Lactic Acid, Venous: 1.4 mmol/L (ref 0.5–2.0)
Lactic Acid, Venous: 1.1 mmol/L (ref 0.5–2.0)

## 2015-04-14 LAB — I-STAT CG4 LACTIC ACID, ED
LACTIC ACID, VENOUS: 1.02 mmol/L (ref 0.5–2.0)
LACTIC ACID, VENOUS: 2.44 mmol/L — AB (ref 0.5–2.0)

## 2015-04-14 LAB — TSH: TSH: 4.115 u[IU]/mL (ref 0.350–4.500)

## 2015-04-14 LAB — PROCALCITONIN: PROCALCITONIN: 0.11 ng/mL

## 2015-04-14 MED ORDER — OXYCODONE HCL 5 MG PO TABS
5.0000 mg | ORAL_TABLET | ORAL | Status: DC | PRN
  Administered 2015-04-14 (×2): 5 mg via ORAL
  Filled 2015-04-14 (×2): qty 1

## 2015-04-14 MED ORDER — SODIUM CHLORIDE 0.9% FLUSH
3.0000 mL | Freq: Two times a day (BID) | INTRAVENOUS | Status: DC
  Administered 2015-04-14 – 2015-04-17 (×5): 3 mL via INTRAVENOUS

## 2015-04-14 MED ORDER — POTASSIUM CHLORIDE CRYS ER 20 MEQ PO TBCR
40.0000 meq | EXTENDED_RELEASE_TABLET | Freq: Three times a day (TID) | ORAL | Status: AC
  Administered 2015-04-14 – 2015-04-15 (×4): 40 meq via ORAL
  Filled 2015-04-14 (×4): qty 2

## 2015-04-14 MED ORDER — ONDANSETRON HCL 4 MG PO TABS: 4.0000 mg | ORAL_TABLET | Freq: Four times a day (QID) | ORAL | Status: DC | PRN

## 2015-04-14 MED ORDER — ZOLPIDEM TARTRATE 5 MG PO TABS
5.0000 mg | ORAL_TABLET | Freq: Every evening | ORAL | Status: DC | PRN
  Administered 2015-04-14: 10 mg via ORAL
  Administered 2015-04-14: 5 mg via ORAL
  Administered 2015-04-16 – 2015-04-17 (×3): 10 mg via ORAL
  Filled 2015-04-14: qty 1
  Filled 2015-04-14 (×4): qty 2

## 2015-04-14 MED ORDER — FENTANYL 25 MCG/HR TD PT72
25.0000 ug | MEDICATED_PATCH | TRANSDERMAL | Status: DC
  Filled 2015-04-14: qty 1

## 2015-04-14 MED ORDER — OXYCODONE HCL 5 MG PO TABS: 15.0000 mg | ORAL_TABLET | ORAL | Status: DC | PRN

## 2015-04-14 MED ORDER — MAGNESIUM SULFATE 2 GM/50ML IV SOLN
2.0000 g | Freq: Once | INTRAVENOUS | Status: AC
  Administered 2015-04-14: 2 g via INTRAVENOUS
  Filled 2015-04-14: qty 50

## 2015-04-14 MED ORDER — PIPERACILLIN-TAZOBACTAM 3.375 G IVPB
3.3750 g | Freq: Three times a day (TID) | INTRAVENOUS | Status: DC
  Administered 2015-04-14 – 2015-04-17 (×12): 3.375 g via INTRAVENOUS
  Filled 2015-04-14 (×12): qty 50

## 2015-04-14 MED ORDER — PANTOPRAZOLE SODIUM 40 MG PO TBEC
40.0000 mg | DELAYED_RELEASE_TABLET | Freq: Every day | ORAL | Status: DC
  Administered 2015-04-14 – 2015-04-17 (×4): 40 mg via ORAL
  Filled 2015-04-14 (×4): qty 1

## 2015-04-14 MED ORDER — OXYCODONE HCL 5 MG PO TABS
10.0000 mg | ORAL_TABLET | ORAL | Status: DC | PRN
  Administered 2015-04-14 – 2015-04-17 (×10): 10 mg via ORAL
  Filled 2015-04-14 (×10): qty 2

## 2015-04-14 MED ORDER — HYDROMORPHONE HCL 1 MG/ML IJ SOLN
1.0000 mg | INTRAMUSCULAR | Status: DC | PRN
  Administered 2015-04-14: 1 mg via INTRAVENOUS
  Administered 2015-04-14: 2 mg via INTRAVENOUS
  Administered 2015-04-14: 1 mg via INTRAVENOUS
  Administered 2015-04-15 (×3): 2 mg via INTRAVENOUS
  Filled 2015-04-14 (×5): qty 2

## 2015-04-14 MED ORDER — LEVALBUTEROL HCL 0.63 MG/3ML IN NEBU: 0.6300 mg | INHALATION_SOLUTION | Freq: Four times a day (QID) | RESPIRATORY_TRACT | Status: DC | PRN

## 2015-04-14 MED ORDER — DOCUSATE SODIUM 100 MG PO CAPS
100.0000 mg | ORAL_CAPSULE | Freq: Two times a day (BID) | ORAL | Status: DC
  Administered 2015-04-14 – 2015-04-17 (×7): 100 mg via ORAL
  Filled 2015-04-14 (×8): qty 1

## 2015-04-14 MED ORDER — SODIUM CHLORIDE 0.9% FLUSH
10.0000 mL | INTRAVENOUS | Status: DC | PRN
  Administered 2015-04-15 – 2015-04-17 (×3): 10 mL
  Filled 2015-04-14 (×3): qty 40

## 2015-04-14 MED ORDER — ALTEPLASE 2 MG IJ SOLR
2.0000 mg | Freq: Once | INTRAMUSCULAR | Status: AC
Start: 2015-04-14 — End: 2015-04-14
  Administered 2015-04-14: 2 mg
  Filled 2015-04-14: qty 2

## 2015-04-14 MED ORDER — SODIUM CHLORIDE 0.9 % IV BOLUS (SEPSIS)
500.0000 mL | Freq: Once | INTRAVENOUS | Status: AC
  Administered 2015-04-14: 500 mL via INTRAVENOUS

## 2015-04-14 MED ORDER — HYDROMORPHONE HCL 1 MG/ML IJ SOLN
1.0000 mg | INTRAMUSCULAR | Status: DC | PRN
  Administered 2015-04-14 – 2015-04-17 (×16): 1 mg via INTRAVENOUS
  Filled 2015-04-14 (×18): qty 1

## 2015-04-14 MED ORDER — POTASSIUM CHLORIDE IN NACL 40-0.9 MEQ/L-% IV SOLN
INTRAVENOUS | Status: DC
  Administered 2015-04-14 – 2015-04-15 (×3): 100 mL/h via INTRAVENOUS
  Filled 2015-04-14 (×4): qty 1000

## 2015-04-14 MED ORDER — STERILE WATER FOR INJECTION IJ SOLN
INTRAMUSCULAR | Status: AC
  Filled 2015-04-14: qty 10

## 2015-04-14 MED ORDER — IOHEXOL 350 MG/ML SOLN
100.0000 mL | Freq: Once | INTRAVENOUS | Status: AC | PRN
  Administered 2015-04-14: 100 mL via INTRAVENOUS

## 2015-04-14 MED ORDER — ACETAMINOPHEN 325 MG PO TABS: 650.0000 mg | ORAL_TABLET | Freq: Four times a day (QID) | ORAL | Status: DC | PRN

## 2015-04-14 MED ORDER — ACETAMINOPHEN 650 MG RE SUPP: 650.0000 mg | Freq: Four times a day (QID) | RECTAL | Status: DC | PRN

## 2015-04-14 MED ORDER — VANCOMYCIN HCL IN DEXTROSE 750-5 MG/150ML-% IV SOLN
750.0000 mg | Freq: Three times a day (TID) | INTRAVENOUS | Status: DC
  Administered 2015-04-14 – 2015-04-15 (×5): 750 mg via INTRAVENOUS
  Filled 2015-04-14 (×8): qty 150

## 2015-04-14 MED ORDER — FENTANYL CITRATE (PF) 100 MCG/2ML IJ SOLN
25.0000 ug | Freq: Once | INTRAMUSCULAR | Status: AC
  Administered 2015-04-14: 25 ug via INTRAVENOUS
  Filled 2015-04-14: qty 2

## 2015-04-14 MED ORDER — POTASSIUM CHLORIDE 10 MEQ/100ML IV SOLN
10.0000 meq | INTRAVENOUS | Status: AC
  Administered 2015-04-14 (×3): 10 meq via INTRAVENOUS
  Filled 2015-04-14: qty 100

## 2015-04-14 MED ORDER — ONDANSETRON HCL 4 MG/2ML IJ SOLN: 4.0000 mg | Freq: Four times a day (QID) | INTRAMUSCULAR | Status: DC | PRN

## 2015-04-14 NOTE — ED Notes (Signed)
Pt is resting with family at bedside.  Pt is requesting pain med.  BP is 111/94.  Hospitalist Abrol paged.

## 2015-04-14 NOTE — Procedures (Signed)
Successful US guided right thoracentesis. Yielded 1L of hazy/cloudy yellow fluid. Pt tolerated procedure well. No immediate complications.  Specimen was sent for labs. CXR ordered.  Ascencion Dike PA-C 04/14/2015 10:28 AM

## 2015-04-14 NOTE — H&P (Addendum)
[facility] History and Physical  Beth Harrison [id number] DOB: May 10, 1970 DOA: 04/17/2015  Referring physician:  PCP: [name], MD   Chief Complaint:    HPI:  45 year old female with a history of gastric cancer, diagnosed 06/28/14, metastatic to left subclavicular, mediastinal and pelvic lymph nodes, prior history of refractory nausea and gastric outlet obstruction, started chemotherapy June 2016 last chemotherapy 04/04/15, seen by her oncologist on 04/11/15 at [facility]. Reportedly continues to have more nausea and fatigue from her last chemotherapy cycle. Refused chemotherapy on 2/14, due to decreased appetite and weight loss. Patient has also noticed low-grade fevers for the last 1 week. Hospitalized for pneumonia 03/27/15. CT scan done on 03/01/15 did not show pulmonary embolism but showed moderate bilateral pleural effusions with scattered groundglass appearance. Chest x-ray on 04/11/15 showed left IJ Port-A-Cath with interval increase in right pleural effusion and increased by basilar opacities representing atelectasis versus pneumonia versus aspiration for which she was prescribed doxycycline by her oncologist Dr. [name]. Patient presented today on 04/25/2015 with worsening shortness of breath. Found to have right-sided pleuritic chest pain, hypotension with systolic blood pressure in the 50s, responsive to fluid boluses and sepsis protocol. Oxygen saturation 67% on room air. CTA of the chest today did not show pulmonary embolism. Short moderate-sized enlarging bilateral pleural effusions. Critical care was consulted. Dr. [name] recommended thoracentesis, possible chest tube placement.       Review of Systems: negative for the following  Constitutional: Denies fever, chills, diaphoresis, appetite change and fatigue.  HEENT: Denies photophobia, eye pain, redness, hearing loss, ear pain, congestion, sore throat, rhinorrhea, sneezing, mouth sores, trouble swallowing,  neck pain, neck stiffness and tinnitus.  Respiratory: Positive for shortness of breath.  Cardiovascular: Denies chest pain, palpitations and leg swelling.  Gastrointestinal: Denies nausea, vomiting, abdominal pain, diarrhea, constipation, blood in stool and abdominal distention.  Genitourinary: Denies dysuria, urgency, frequency, hematuria, flank pain and difficulty urinating.  Musculoskeletal: Denies myalgias, back pain, joint swelling, arthralgias and gait problem.  Skin: Denies pallor, rash and wound.  Neurological: Positive for weakness, dizziness, light-headedness, numbness and headaches.  Hematological: Denies adenopathy. Easy bruising, personal or family bleeding history  Psychiatric/Behavioral: Denies suicidal ideation, mood changes, confusion, nervousness, sleep disturbance and agitation       Past Medical History  Diagnosis Date  . Varicose vein   . Chlamydia     h/o positive  . Stomach cancer [facility])     diag 4/16-on 9th round of chemo at [facility]  . Alopecia     after Implanon     Past Surgical History  Procedure Laterality Date  . Cesarean section      x 2  . Breast reduction surgery      bilateral  . Portacath placement      on right side, area got infected-moved to left side  . Breast lumpectomy  2006    right-fibroadenoma      Social History:  reports that she has never smoked. She has never used smokeless tobacco. She reports that she does not drink alcohol or use illicit drugs.    Allergies  Allergen Reactions  . Ciprofloxacin Hives  . Doxycycline Nausea And Vomiting    Family History  Problem Relation Age of Onset  . Diabetes Sister   . Hypertension Mother   . Hypertension Brother   . Colon cancer Neg Hx   . Lung cancer Maternal Grandmother   . Breast cancer Paternal Grandmother 64         Prior  to Admission medications   Medication Sig Start Date End Date Taking? Authorizing Provider  clindamycin (CLEOCIN) 300 MG capsule Take 300 mg by  mouth 4 (four) times daily - after meals and at bedtime. 04/08/2015 04/20/15 Yes Historical Provider, MD  dronabinol (MARINOL) 2.5 MG capsule Take 2.5 mg by mouth 2 (two) times daily. 04/11/15  Yes Historical Provider, MD  fentaNYL (DURAGESIC - DOSED MCG/HR) 25 MCG/HR patch Place 1 patch onto the skin every 3 (three) days. 03/28/15  Yes Historical Provider, MD  hydrochlorothiazide (HYDRODIURIL) 25 MG tablet Take 25 mg by mouth daily. 04/06/15  Yes Historical Provider, MD  lisinopril (PRINIVIL,ZESTRIL) 20 MG tablet Take 20 mg by mouth daily. 03/20/15  Yes Historical Provider, MD  oxyCODONE (ROXICODONE) 15 MG immediate release tablet Take 15 mg by mouth every 4 (four) hours as needed for pain.  12/27/14  Yes Historical Provider, MD  pantoprazole (PROTONIX) 40 MG tablet Take 40 mg by mouth daily.  10/11/14  Yes Historical Provider, MD  potassium chloride SA (KLOR-CON M20) 20 MEQ tablet Take 20 mEq by mouth daily.  11/24/14  Yes Historical Provider, MD  traMADol (ULTRAM) 50 MG tablet Take 50 mg by mouth every 6 (six) hours as needed for moderate pain or severe pain.  03/28/15  Yes Historical Provider, MD  zolpidem (AMBIEN) 10 MG tablet Take 5-10 mg by mouth at bedtime as needed for sleep.    Yes Historical Provider, MD  dicyclomine (BENTYL) 20 MG tablet Take 1 tablet (20 mg total) by mouth every 6 (six) hours. Patient not taking: Reported on 04/14/2015 01/30/15   Zada Finders, MD     Physical Exam: Filed Vitals:   04/14/15 0715 04/14/15 0730 04/14/15 0745 04/14/15 0800  BP: 111/90 112/93 109/92 105/90  Pulse: 122 121 125 120  Temp:      TempSrc:      Resp: 26 20 22 20   Height:      Weight:      SpO2: 100% 100% 100% 100%     Constitutional: Vital signs reviewed. Patient is chronically ill-appearing, seems to be uncomfortable because of pain, Alert and oriented x3.  Head: Normocephalic and atraumatic  Ear: TM normal bilaterally  Mouth: no erythema or exudates, MMM  Eyes: PERRL, EOMI, conjunctivae normal,  No scleral icterus.  Neck: Supple, Trachea midline normal ROM, No JVD, mass, thyromegaly, or carotid bruit present.  Cardiovascular: Tachycardic, S1 normal and S2 normal. Tachycardia present. Exam reveals no gallop and no friction rub.  No murmur heard. Pulmonary/Chest: Tachypnea noted. She is in respiratory distress. She has decreased breath sounds. She exhibits no tenderness.  Breath sounds severely diminished throughout.  Abdominal: Soft. Non-tender, non-distended, bowel sounds are normal, no masses, organomegaly, or guarding present.  GU: no CVA tenderness Musculoskeletal: No joint deformities, erythema, or stiffness, ROM full and no nontender Ext: no edema and no cyanosis, pulses palpable bilaterally (DP and PT)  Hematology: no cervical, inginal, or axillary adenopathy.  Neurological: A&O x3, Strenght is normal and symmetric bilaterally, cranial nerve II-XII are grossly intact, no focal motor deficit, sensory intact to light touch bilaterally.  Skin: Warm, dry and intact. No rash, cyanosis, or clubbing.  Psychiatric: Normal mood and affect. speech and behavior is normal. Judgment and thought content normal. Cognition and memory are normal.      Data Review   Micro Results Recent Results (from the past 240 hour(s))  Blood Culture (routine x 2)     Status: None (Preliminary result)   Collection Time: 04/14/15  12:11 AM  Result Value Ref Range Status   Specimen Description BLOOD RIGHT ANTECUBITAL  Final   Special Requests BOTTLES DRAWN AEROBIC AND ANAEROBIC 5CC  Final   Culture PENDING  Incomplete   Report Status PENDING  Incomplete  Blood Culture (routine x 2)     Status: None (Preliminary result)   Collection Time: 04/14/15 12:30 AM  Result Value Ref Range Status   Specimen Description BLOOD RIGHT HAND  Final   Special Requests BOTTLES DRAWN AEROBIC AND ANAEROBIC 4CC  Final   Culture PENDING  Incomplete   Report Status PENDING  Incomplete    Radiology Reports Ct Angio  Chest Pe W/cm &/or Wo Cm  04/14/2015  CLINICAL DATA:  Weakness, shortness of breath, hypotension. Recent treatment for pneumonia. EXAM: CT ANGIOGRAPHY CHEST WITH CONTRAST TECHNIQUE: Multidetector CT imaging of the chest was performed using the standard protocol during bolus administration of intravenous contrast. Multiplanar CT image reconstructions and MIPs were obtained to evaluate the vascular anatomy. CONTRAST:  163mL OMNIPAQUE IOHEXOL 350 MG/ML SOLN COMPARISON:  01/30/2015 FINDINGS: Technically adequate study with good opacification of the central and segmental pulmonary arteries. No focal filling defects demonstrated. No evidence of significant pulmonary embolus. Normal heart size. Normal caliber thoracic aorta. Great vessel origins are patent. Left central venous catheter with tip in the SVC. Esophagus is decompressed. No significant lymphadenopathy in the chest. There are moderate size bilateral pleural effusions with atelectasis in the lung bases bilaterally. This is progressing since previous study. No pneumothorax. Included portions of the upper abdominal organs are grossly unremarkable. No destructive bone lesions. Review of the MIP images confirms the above findings. IMPRESSION: No evidence of significant pulmonary embolus. Moderate-sized enlarging bilateral pleural effusions with basilar atelectasis or consolidation. Electronically Signed   By: [name] M.D.   On: 04/14/2015 02:07   Dg Chest Port 1 View  04/06/2015  CLINICAL DATA:  Gradual onset weakness since earlier tonight. Shortness of breath. Pain with breathing. Hypotension. History of stomach cancer. Diagnosed with pneumonia a month ago. EXAM: PORTABLE CHEST 1 VIEW COMPARISON:  02/15/2015 FINDINGS: Power port type central venous catheter with tip over the mid SVC region. No pneumothorax. Shallow inspiration. Heart size and pulmonary vascularity are normal for technique. Small bilateral pleural effusions with basilar atelectasis or  consolidation bilaterally. Changes are greater on the right. Changes are new since previous study. IMPRESSION: Bilateral pleural effusions with basilar atelectasis or infiltration, greater on the right. Electronically Signed   By: [name] M.D.   On: 04/23/2015 23:59     CBC  Recent Labs Lab 04/14/15 0011 04/14/15 0029  WBC 7.1  --   HGB 9.7* 11.2*  HCT 30.7* 33.0*  PLT 345  --   MCV 85.0  --   MCH 26.9  --   MCHC 31.6  --   RDW 16.3*  --   LYMPHSABS 0.9  --   MONOABS 0.7  --   EOSABS 0.0  --   BASOSABS 0.0  --     Chemistries   Recent Labs Lab 04/14/15 0011 04/14/15 0029  NA 130* 131*  K 2.4* 2.4*  CL 92* 90*  CO2 25  --   GLUCOSE 123* 118*  BUN 7 5*  CREATININE 0.96 0.80  CALCIUM 7.5*  --   AST 16  --   ALT 8*  --   ALKPHOS 86  --   BILITOT 0.7  --    ------------------------------------------------------------------------------------------------------------------ estimated creatinine clearance is 84.2 mL/min (by C-G formula based  on Cr of 0.8). ------------------------------------------------------------------------------------------------------------------ No results for input(s): HGBA1C in the last 72 hours. ------------------------------------------------------------------------------------------------------------------ No results for input(s): CHOL, HDL, LDLCALC, TRIG, CHOLHDL, LDLDIRECT in the last 72 hours. ------------------------------------------------------------------------------------------------------------------ No results for input(s): TSH, T4TOTAL, T3FREE, THYROIDAB in the last 72 hours.  Invalid input(s): FREET3 ------------------------------------------------------------------------------------------------------------------ No results for input(s): VITAMINB12, FOLATE, FERRITIN, TIBC, IRON, RETICCTPCT in the last 72 hours.  Coagulation profile No results for input(s): INR, PROTIME in the last 168 hours.  No results for input(s):  DDIMER in the last 72 hours.  Cardiac Enzymes No results for input(s): CKMB, TROPONINI, MYOGLOBIN in the last 168 hours.  Invalid input(s): CK ------------------------------------------------------------------------------------------------------------------ Invalid input(s): POCBNP   CBG: No results for input(s): GLUCAP in the last 168 hours.     EKG: Independently reviewed.   Assessment/Plan Principal Problem:   Acute hypoxemic respiratory failure (HCC) Likely secondary to bilateral pleural effusions Right greater than left Patient noted to have bilateral thoracentesis, labs ordered for  Pleural  fluid analysis Critical care consulted and will continue to follow the patient Patient may need CT surgery consultation for chest tube placement  Sepsis-likely secondary to pneumonia, started on broad-spectrum antibiotics, Zosyn, vancomycin Follow sepsis protocol Follow blood culture, pleural fluid culture Initial lactate 2.44    Malignant neoplasm of stomach (HCC)-Followed by Dr. Karle Starch at [facility].  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 Hydrate patient and repeat electrolytes, check TSH     Code Status Orders        Start     Ordered   04/14/15 0811  Full code   Continuous     04/14/15 0813    Code Status History    Date Active Date Inactive Code Status Order ID Comments User Context   This patient has a current code status but no historical code status.      Family Communication: bedside Disposition Plan: admit   Total time spent 55 minutes.Greater than 50% of this time was spent in counseling, explanation of diagnosis, planning of further management, and coordination of care  [facility] Pager  [phone]  If 7PM-7AM, please contact night-coverage [email or web address] Password [facility] 04/14/2015, 8:13 AM

## 2015-04-14 NOTE — ED Notes (Signed)
Patient transported to CT 

## 2015-04-14 NOTE — Progress Notes (Signed)
Utilization Review completed.  Poseidon Pam RN CM  

## 2015-04-14 NOTE — Progress Notes (Signed)
Pharmacy Antibiotic Note  Beth Harrison is a 45 y.o. female admitted on 04/10/2015 with sepsis.  Pharmacy has been consulted for Vancomycin and Zosyn  dosing.  Plan: Vancomycin 750 IV every q8 hours.  Goal trough 15-20 mcg/mL. Zosyn 3.375g IV q8h (4 hour infusion).  Height: 5' 5.5" (166.4 cm) Weight: 144 lb (65.318 kg) IBW/kg (Calculated) : 58.15  Temp (24hrs), Avg:99.2 F (37.3 C), Min:97.7 F (36.5 C), Max:101.2 F (38.4 C)   Recent Labs Lab 04/14/15 0011 04/14/15 0029 04/14/15 0249  WBC 7.1  --   --   CREATININE 0.96 0.80  --   LATICACIDVEN  --  2.44* 1.02    Estimated Creatinine Clearance: 84.2 mL/min (by C-G formula based on Cr of 0.8).    Allergies  Allergen Reactions  . Ciprofloxacin Hives  . Doxycycline Nausea And Vomiting     Thank you for allowing pharmacy to be a part of this patient's care.  [name] 04/14/2015 6:27 AM

## 2015-04-14 NOTE — ED Notes (Signed)
Informed Dr. Betsey Holiday of lactic acid of 2.44.

## 2015-04-14 NOTE — ED Notes (Signed)
IV team was at bedside.  Cleared pt's PAC for use

## 2015-04-14 NOTE — ED Notes (Signed)
IV team in room to access port

## 2015-04-14 NOTE — ED Notes (Signed)
Per EMS pt has stomach cancer and her last chemo was 3 weeks ago  Pt has been lethargic today  Pt is alert and oriented x 3  Pt hypotensive 52/28 upon EMS arrival  IV started and pt was given 1023ml NS bolus  Last B/P by EMS was 66/43

## 2015-04-14 NOTE — ED Notes (Signed)
Patient transported to Ultrasound for thoracentesis 

## 2015-04-14 NOTE — Consult Note (Signed)
Name: Beth Harrison MRN: [id number] DOB: 1970/06/25    ADMISSION DATE:  04/02/2015 CONSULTATION DATE:  2/17  REFERRING MD :  EDP  CHIEF COMPLAINT:  SOB  BRIEF PATIENT DESCRIPTION: 45 year old female with PMH as below, which includes metastatic gastric cancer (followed by Dr. [name] at [facility].) Gastric cancer first diagnosed 05/2014. She initially underwent 8 cyclse of FOLFOX. 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 She resumed treatment with cycle 9 of single agent 5-FU on 12/27/14. PET scan 01/24/2015 showed progression. CT scan 01/26/2015 was concerning for progression of disease. Treatment with Irinotecan was then initiated. She started cycle 1 of Irintoecan on 02/07/2015. She received cycle 2 on 02/21/15. Due to worsening pain , a repeat CT abd/pelvis was obtained after cycle 2 on 03/01/15 showing no findings suggestive of progression with similar to slightly decreased size of several peritoneal nodules. She was hospitalized for pain control and then for pneumonia in 02/2015. She resumed chemotherapy with cycle 3 of Irinotecan on 03/28/15. Immediately following that cycle she began to feel unwell. Cycle 4 was delayed and still pending due to those symptoms.   2/16 late PM she presented to [facility] ED with complaints of progressive weakness x 1 day. She has been febrile as well. She was recently prescribed clindamycin for what is thought to be recurrent PNA. She complains of R sided pleuritic chest pain as well. In ED she was found to be profoundly hypotensive with SBP in the 50s, which was responsive to 30cc/Kg IVF bolus. She was also markedly hypoxemic with O2 sats registering 67% on room air. She was started on broad ABX and supplemental O2. CXR demonstrated bilateral effusions R>L, quite large on the right. CTA was obtained to better characterize effusions. No PE noted. It was felt as though the patient would require thoracentesis and potentially ICU admission. PCCM called.   SIGNIFICANT EVENTS    02/2014 admit to [facility] for PNA 2/17 admit  STUDIES:  Echo 02/2014 ([facility]) >> intact LV fxn, no PAH CTA chest 2/17 > No evidence of significant pulmonary embolus. Moderate-sized enlarging bilateral pleural effusions with basilar atelectasis or consolidation. Echo 1/21 > LVEF 55-60% mild pulm HTN,    PAST MEDICAL HISTORY :   has a past medical history of Varicose vein; Chlamydia; Stomach cancer ([place]); and Alopecia.  has past surgical history that includes Cesarean section; Breast reduction surgery; Portacath placement; and Breast lumpectomy (2006). Prior to Admission medications   Medication Sig Start Date End Date Taking? Authorizing Provider  clindamycin (CLEOCIN) 300 MG capsule Take 300 mg by mouth 4 (four) times daily - after meals and at bedtime. 04/12/2015 04/20/15 Yes Historical Provider, MD  dronabinol (MARINOL) 2.5 MG capsule Take 2.5 mg by mouth 2 (two) times daily. 04/11/15  Yes Historical Provider, MD  fentaNYL (DURAGESIC - DOSED MCG/HR) 25 MCG/HR patch Place 1 patch onto the skin every 3 (three) days. 03/28/15  Yes Historical Provider, MD  hydrochlorothiazide (HYDRODIURIL) 25 MG tablet Take 25 mg by mouth daily. 04/06/15  Yes Historical Provider, MD  lisinopril (PRINIVIL,ZESTRIL) 20 MG tablet Take 20 mg by mouth daily. 03/20/15  Yes Historical Provider, MD  oxyCODONE (ROXICODONE) 15 MG immediate release tablet Take 15 mg by mouth every 4 (four) hours as needed for pain.  12/27/14  Yes Historical Provider, MD  pantoprazole (PROTONIX) 40 MG tablet Take 40 mg by mouth daily.  10/11/14  Yes Historical Provider, MD  potassium chloride SA (KLOR-CON M20) 20 MEQ tablet Take 20 mEq by mouth daily.  11/24/14  Yes Historical Provider, MD  traMADol (ULTRAM) 50 MG tablet Take 50 mg by mouth every 6 (six) hours as needed for moderate pain or severe pain.  03/28/15  Yes Historical Provider, MD  zolpidem (AMBIEN) 10 MG tablet Take 5-10 mg by mouth at bedtime as needed for sleep.    Yes Historical Provider, MD   dicyclomine (BENTYL) 20 MG tablet Take 1 tablet (20 mg total) by mouth every 6 (six) hours. Patient not taking: Reported on 04/14/2015 01/30/15   Zada Finders, MD   Allergies  Allergen Reactions  . Ciprofloxacin Hives  . Doxycycline Nausea And Vomiting    FAMILY HISTORY:  family history includes Breast cancer (age of onset: 57) in her paternal grandmother; Diabetes in her sister; Hypertension in her brother and mother; Lung cancer in her maternal grandmother. There is no history of Colon cancer. SOCIAL HISTORY:  reports that she has never smoked. She has never used smokeless tobacco. She reports that she does not drink alcohol or use illicit drugs.  REVIEW OF SYSTEMS:   Review of Systems:   Bolds are positive  Constitutional: weight loss, gain, night sweats, Fevers, chills, fatigue .  HEENT: headaches, Sore throat, sneezing, nasal congestion, post nasal drip, Difficulty swallowing, Tooth/dental problems, visual complaints visual changes, ear ache CV:  chest pain, radiates: ,Orthopnea, PND, swelling in lower extremities, dizziness, palpitations, syncope.  GI  heartburn, indigestion, abdominal pain, nausea, vomiting, diarrhea, change in bowel habits, loss of appetite, bloody stools.  Resp: cough, productive: , hemoptysis, dyspnea, chest pain, pleuritic.  Skin: rash or itching or icterus GU: dysuria, change in color of urine, urgency or frequency. flank pain, hematuria  MS: joint pain or swelling. decreased range of motion  Psych: change in mood or affect. depression or anxiety.  Neuro: difficulty with speech, weakness, numbness, ataxia    SUBJECTIVE:  Feels SOB  VITAL SIGNS: Temp:  [97.7 F (36.5 C)-101.2 F (38.4 C)] 98.7 F (37.1 C) (02/17 0255) Pulse Rate:  [120-130] 122 (02/17 0500) Resp:  [14-39] 14 (02/17 0500) BP: (78-155)/(35-91) 102/78 mmHg (02/17 0500) SpO2:  [77 %-100 %] 100 % (02/17 0500) Weight:  [65.318 kg (144 lb)] 65.318 kg (144 lb) (02/17 0043)  PHYSICAL  EXAMINATION: General:  Female of normal body habitus in NAD on O2 via [place] Neuro:  Alert, oriented, non-focal HEENT:  [place]/AT, PERRL Cardiovascular:  RRR, no MRG Lungs:  Diminished bilateral breath sounds Abdomen:  Soft, non-tender, non-distended Musculoskeletal:  No acute deformity or ROM limitation Skin:  Grossly intact.    Recent Labs Lab 04/14/15 0011 04/14/15 0029  NA 130* 131*  K 2.4* 2.4*  CL 92* 90*  CO2 25  --   BUN 7 5*  CREATININE 0.96 0.80  GLUCOSE 123* 118*    Recent Labs Lab 04/14/15 0011 04/14/15 0029  HGB 9.7* 11.2*  HCT 30.7* 33.0*  WBC 7.1  --   PLT 345  --    Ct Angio Chest Pe W/cm &/or Wo Cm  04/14/2015  CLINICAL DATA:  Weakness, shortness of breath, hypotension. Recent treatment for pneumonia. EXAM: CT ANGIOGRAPHY CHEST WITH CONTRAST TECHNIQUE: Multidetector CT imaging of the chest was performed using the standard protocol during bolus administration of intravenous contrast. Multiplanar CT image reconstructions and MIPs were obtained to evaluate the vascular anatomy. CONTRAST:  119mL OMNIPAQUE IOHEXOL 350 MG/ML SOLN COMPARISON:  01/30/2015 FINDINGS: Technically adequate study with good opacification of the central and segmental pulmonary arteries. No focal filling defects demonstrated. No evidence of significant pulmonary  embolus. Normal heart size. Normal caliber thoracic aorta. Great vessel origins are patent. Left central venous catheter with tip in the SVC. Esophagus is decompressed. No significant lymphadenopathy in the chest. There are moderate size bilateral pleural effusions with atelectasis in the lung bases bilaterally. This is progressing since previous study. No pneumothorax. Included portions of the upper abdominal organs are grossly unremarkable. No destructive bone lesions. Review of the MIP images confirms the above findings. IMPRESSION: No evidence of significant pulmonary embolus. Moderate-sized enlarging bilateral pleural effusions with basilar  atelectasis or consolidation. Electronically Signed   By: [name] M.D.   On: 04/14/2015 02:07   Dg Chest Port 1 View  04/17/2015  CLINICAL DATA:  Gradual onset weakness since earlier tonight. Shortness of breath. Pain with breathing. Hypotension. History of stomach cancer. Diagnosed with pneumonia a month ago. EXAM: PORTABLE CHEST 1 VIEW COMPARISON:  02/15/2015 FINDINGS: Power port type central venous catheter with tip over the mid SVC region. No pneumothorax. Shallow inspiration. Heart size and pulmonary vascularity are normal for technique. Small bilateral pleural effusions with basilar atelectasis or consolidation bilaterally. Changes are greater on the right. Changes are new since previous study. IMPRESSION: Bilateral pleural effusions with basilar atelectasis or infiltration, greater on the right. Electronically Signed   By: [name] M.D.   On: 04/15/2015 23:59    ASSESSMENT / PLAN:  Acute hypoxemic respiratory failure P: - supplemental O2 to maintain SpO2 > 92% - IS per RT protocol  Bilateral pleural effusions R>L -  Effusions almost certainly cause of respiratory distress and hypoxemia. Several possible etiologies including parapneumonic, empyema, metastatic effusion. Consider also transudate due to her malignancy / nutritional status. Ultimately will probably need large bore chest tube drainage, particularly on the R. However, based on CT there is some concern for complicated effusion with potential loculations. Better approach may be to perform diagnostic/therapeutic thoracentesis to better evaluate the contents of the effusion so it can be appropriately drained.  P: - Check coags - Diagnostic thoracentesis. Send fluid for protein, LDH, cell count with diff, cytology, gram stain, culture  - May need chest tube or CVTS depending on character of effusion  Possible HCAP - HCAP coverage with Zosyn, Vancomycin - Trend WBC and fever curve  Gastric Cancer - Management per  primary team  Georgann Housekeeper, AGACNP-BC [facility] Pager [phone] or [phone] 04/14/2015 6:22 AM   Attending Note:  I have examined patient, reviewed labs, studies and notes. I have discussed the case with Jaclynn Guarneri, and I agree with the data and plans as amended above.  Pt presented with hypotension that responded to IVF, B (R>>L) effusions following a recent admission for B HCAP at [facility]. I believe she needs thoracentesis, possibly chest tube. DDx for her effusions > malignant, parapneumonic / empyema, transudative. The R effusion could have loculations. Will check coags, plan to sample the fluid on the R, then plan next steps, potential chest tube, TCTS eval, etc.  Baltazar Apo, MD, PhD 04/14/2015, 6:28 AM [facility] [phone] or if no answer [phone]

## 2015-04-14 NOTE — ED Notes (Signed)
RN Darcella Cheshire going to attempt to draw blood back from port

## 2015-04-14 NOTE — ED Notes (Signed)
Below order not completed by EW. 

## 2015-04-15 DIAGNOSIS — J948 Other specified pleural conditions: Secondary | ICD-10-CM

## 2015-04-15 DIAGNOSIS — E871 Hypo-osmolality and hyponatremia: Secondary | ICD-10-CM

## 2015-04-15 DIAGNOSIS — E876 Hypokalemia: Secondary | ICD-10-CM

## 2015-04-15 DIAGNOSIS — J9601 Acute respiratory failure with hypoxia: Secondary | ICD-10-CM

## 2015-04-15 LAB — CBC
HCT: 30.2 % — ABNORMAL LOW (ref 36.0–46.0)
Hemoglobin: 9.6 g/dL — ABNORMAL LOW (ref 12.0–15.0)
MCH: 27.4 pg (ref 26.0–34.0)
MCHC: 31.8 g/dL (ref 30.0–36.0)
MCV: 86 fL (ref 78.0–100.0)
Platelets: 374 K/uL (ref 150–400)
RBC: 3.51 MIL/uL — ABNORMAL LOW (ref 3.87–5.11)
RDW: 16.7 % — ABNORMAL HIGH (ref 11.5–15.5)
WBC: 6.2 K/uL (ref 4.0–10.5)

## 2015-04-15 LAB — URINE CULTURE: Culture: 4000

## 2015-04-15 LAB — COMPREHENSIVE METABOLIC PANEL
ALBUMIN: 2.1 g/dL — AB (ref 3.5–5.0)
ALT: 10 U/L — AB (ref 14–54)
AST: 17 U/L (ref 15–41)
Alkaline Phosphatase: 82 U/L (ref 38–126)
Anion gap: 5 (ref 5–15)
CHLORIDE: 105 mmol/L (ref 101–111)
CO2: 24 mmol/L (ref 22–32)
Calcium: 7.9 mg/dL — ABNORMAL LOW (ref 8.9–10.3)
Creatinine, Ser: 0.56 mg/dL (ref 0.44–1.00)
GFR calc Af Amer: >60 mL/min (ref >60)
GFR calc non Af Amer: >60 mL/min (ref >60)
GLUCOSE: 96 mg/dL (ref 65–99)
POTASSIUM: 4.5 mmol/L (ref 3.5–5.1)
SODIUM: 134 mmol/L — AB (ref 135–145)
Total Bilirubin: 0.4 mg/dL (ref 0.3–1.2)
Total Protein: 5.7 g/dL — ABNORMAL LOW (ref 6.5–8.1)

## 2015-04-15 LAB — TRIGLYCERIDES, BODY FLUIDS: Triglycerides, Fluid: 34 mg/dL

## 2015-04-15 MED ORDER — MORPHINE SULFATE (PF) 2 MG/ML IV SOLN
2.0000 mg | INTRAVENOUS | Status: DC | PRN
  Administered 2015-04-17 (×2): 4 mg via INTRAVENOUS
  Administered 2015-04-17: 2 mg via INTRAVENOUS
  Administered 2015-04-17: 4 mg via INTRAVENOUS
  Filled 2015-04-15: qty 2
  Filled 2015-04-15: qty 1
  Filled 2015-04-15 (×2): qty 2

## 2015-04-15 MED ORDER — KETOROLAC TROMETHAMINE 30 MG/ML IJ SOLN
30.0000 mg | Freq: Four times a day (QID) | INTRAMUSCULAR | Status: DC | PRN
  Administered 2015-04-15 – 2015-04-17 (×9): 30 mg via INTRAVENOUS
  Filled 2015-04-15 (×10): qty 1

## 2015-04-15 MED ORDER — MAGNESIUM SULFATE 2 GM/50ML IV SOLN
2.0000 g | Freq: Once | INTRAVENOUS | Status: AC
  Administered 2015-04-15: 2 g via INTRAVENOUS
  Filled 2015-04-15: qty 50

## 2015-04-15 NOTE — Progress Notes (Signed)
Patient ID: Beth Harrison, female   DOB: Jan 28, 1971, 45 y.o.   MRN: [id number]  [facility] PROGRESS NOTE  Beth Harrison [id number] DOB: February 02, 1971 DOA: 03/29/2015 PCP: Beth Seashore, MD   Brief narrative:    45 year old female with a history of gastric cancer, diagnosed 06/28/14, metastatic to left subclavicular, mediastinal and pelvic lymph nodes, prior history of refractory nausea and gastric outlet obstruction, started chemotherapy June 2016 last chemotherapy 04/04/15, seen by her oncologist on 04/11/15 at [facility]. Reportedly continues to have more nausea and fatigue from her last chemotherapy cycle. Refused chemotherapy on 2/14, due to decreased appetite and weight loss. Patient has also noticed low-grade fevers for the last 1 week. Hospitalized for pneumonia 03/27/15. CT scan done on 03/01/15 did not show pulmonary embolism but showed moderate bilateral pleural effusions with scattered groundglass appearance. Chest x-ray on 04/11/15 showed left IJ Port-A-Cath with interval increase in right pleural effusion and increased by basilar opacities representing atelectasis versus pneumonia versus aspiration for which she was prescribed doxycycline by her oncologist Dr. Nelda Harrison. Patient presented today on 04/09/2015 with worsening shortness of breath. Found to have right-sided pleuritic chest pain, hypotension with systolic blood pressure in the 50s, responsive to fluid boluses and sepsis protocol. Oxygen saturation 67% on room air. CTA of the chest today did not show pulmonary embolism. Short moderate-sized enlarging bilateral pleural effusions. Critical care was consulted. Dr. Lamonte Harrison recommended thoracentesis, possible chest tube placement.   Assessment/Plan:    Principal Problem:   Acute hypoxemic respiratory failure (HCC) - secondary to malignant pleural effusions, ? HCAP - s/p therapeutic and diagnostic thoracentesis, post procedure day #1 - pt still with exertional dyspnea   - since pt afebrile and with normal WBC, suspect we can narrow down ABX in next 24 hours if remains stable  - continue supportive care   Active Problems:   Hypokalemia, Hypomagnesemia  - supplemented and WNL    Hyponatremia - mild, monitor   DVT prophylaxis - SCD's  Code Status: Full.  Family Communication:  plan of care discussed with the patient and mother at bedside  Disposition Plan: Home by 2/22  IV access:  Peripheral IV  Procedures and diagnostic studies:    Dg Chest 1 View  04/14/2015  CLINICAL DATA:  Status post right thoracentesis. EXAM: CHEST 1 VIEW COMPARISON:  April 13, 2015. FINDINGS: Stable cardiomediastinal silhouette. No pneumothorax is noted. Stable mild left pleural effusion is noted with associated atelectasis or infiltrate. Right pleural effusion is significantly smaller status post thoracentesis. Mild residual fluid and atelectasis remains. No change in position of left internal jugular catheter with distal tip in expected position of the SVC. Bony thorax is unremarkable. IMPRESSION: Right pleural effusion is significantly smaller status post thoracentesis. No pneumothorax is noted. Electronically Signed   By: [name], M.D.   On: 04/14/2015 10:40   Ct Angio Chest Pe W/cm &/or Wo Cm  04/14/2015  CLINICAL DATA:  Weakness, shortness of breath, hypotension. Recent treatment for pneumonia. EXAM: CT ANGIOGRAPHY CHEST WITH CONTRAST TECHNIQUE: Multidetector CT imaging of the chest was performed using the standard protocol during bolus administration of intravenous contrast. Multiplanar CT image reconstructions and MIPs were obtained to evaluate the vascular anatomy. CONTRAST:  125mL OMNIPAQUE IOHEXOL 350 MG/ML SOLN COMPARISON:  01/30/2015 FINDINGS: Technically adequate study with good opacification of the central and segmental pulmonary arteries. No focal filling defects demonstrated. No evidence of significant pulmonary embolus. Normal heart size. Normal caliber  thoracic aorta. Great vessel origins are patent. Left central  venous catheter with tip in the SVC. Esophagus is decompressed. No significant lymphadenopathy in the chest. There are moderate size bilateral pleural effusions with atelectasis in the lung bases bilaterally. This is progressing since previous study. No pneumothorax. Included portions of the upper abdominal organs are grossly unremarkable. No destructive bone lesions. Review of the MIP images confirms the above findings. IMPRESSION: No evidence of significant pulmonary embolus. Moderate-sized enlarging bilateral pleural effusions with basilar atelectasis or consolidation. Electronically Signed   By: Beth Harrison M.D.   On: 04/14/2015 02:07   Dg Chest Port 1 View  04/16/2015  CLINICAL DATA:  Gradual onset weakness since earlier tonight. Shortness of breath. Pain with breathing. Hypotension. History of stomach cancer. Diagnosed with pneumonia a month ago. EXAM: PORTABLE CHEST 1 VIEW COMPARISON:  02/15/2015 FINDINGS: Power port type central venous catheter with tip over the mid SVC region. No pneumothorax. Shallow inspiration. Heart size and pulmonary vascularity are normal for technique. Small bilateral pleural effusions with basilar atelectasis or consolidation bilaterally. Changes are greater on the right. Changes are new since previous study. IMPRESSION: Bilateral pleural effusions with basilar atelectasis or infiltration, greater on the right. Electronically Signed   By: Beth Harrison M.D.   On: 04/09/2015 23:59   US Thoracentesis Asp Pleural Space W/img Guide  04/14/2015  INDICATION: 45 year old female with gastric cancer. Recently treated for pneumonia. Large right and left pleural effusions. Request diagnostic and therapeutic thoracentesis. EXAM: ULTRASOUND GUIDED RIGHT THORACENTESIS MEDICATIONS: None. COMPLICATIONS: None immediate. Patient complained of right-sided chest discomfort and cough and requested the procedure be terminated.  PROCEDURE: An ultrasound guided thoracentesis was thoroughly discussed with the patient and questions answered. The benefits, risks, alternatives and complications were also discussed. The patient understands and wishes to proceed with the procedure. Written consent was obtained. Ultrasound was performed to localize and mark an adequate pocket of fluid in the right chest. The area was then prepped and draped in the normal sterile fashion. 1% Lidocaine was used for local anesthesia. Under ultrasound guidance a Safe-T-Centesis catheter was introduced. Thoracentesis was performed. The catheter was removed and a dressing applied. FINDINGS: A total of approximately 1 L of hazy, cloudy yellowish green fluid was removed. Samples were sent to the laboratory as requested by the clinical team. IMPRESSION: Successful ultrasound guided right thoracentesis yielding 1 L of pleural fluid. Read by: Ascencion Dike PA-C Electronically Signed   By: Sandi Mariscal M.D.   On: 04/14/2015 10:30    Medical Consultants:  PCCM IR  Other Consultants:  None  IAnti-Infectives:   Vancomycin 2/17 --> Zosyn 2/17 -->  Faye Ramsay, MD  [facility] Pager [phone]  If 7PM-7AM, please contact night-coverage [email or web address] Password [facility] 04/15/2015, 4:50 PM   LOS: 1 day   HPI/Subjective: No events overnight.   Objective: Filed Vitals:   04/14/15 2059 04/15/15 0632 04/15/15 0900 04/15/15 1400  BP: 111/82 111/88 124/94 101/76  Pulse: 118 127 119 123  Temp: 97.9 F (36.6 C) 97.6 F (36.4 C)  98.2 F (36.8 C)  TempSrc: Oral Oral  Oral  Resp: 18 18 17 18   Height:      Weight:      SpO2: 98% 100% 100% 100%    Intake/Output Summary (Last 24 hours) at 04/15/15 1650 Last data filed at 04/15/15 1500  Gross per 24 hour  Intake 3683.33 ml  Output      0 ml  Net 3683.33 ml    Exam:   General:  Pt is alert, follows commands appropriately, not in  acute distress  Cardiovascular: Regular rhythm, tachycardic, S1/S2, no  murmurs, no rubs, no gallops  Respiratory: diminished breath sounds at bases   Abdomen: Soft, non tender, non distended, bowel sounds present, no guarding  Data Reviewed: Basic Metabolic Panel:  Recent Labs Lab 04/14/15 0011 04/14/15 0029 04/14/15 0806 04/15/15 0436  NA 130* 131*  --  134*  K 2.4* 2.4*  --  4.5  CL 92* 90*  --  105  CO2 25  --   --  24  GLUCOSE 123* 118*  --  96  BUN 7 5*  --  <5*  CREATININE 0.96 0.80  --  0.56  CALCIUM 7.5*  --   --  7.9*  MG  --   --  1.4*  --    Liver Function Tests:  Recent Labs Lab 04/14/15 0011 04/15/15 0436  AST 16 17  ALT 8* 10*  ALKPHOS 86 82  BILITOT 0.7 0.4  PROT 5.8* 5.7*  ALBUMIN 2.2* 2.1*   CBC:  Recent Labs Lab 04/14/15 0011 04/14/15 0029 04/15/15 0436  WBC 7.1  --  6.2  NEUTROABS 5.5  --   --   HGB 9.7* 11.2* 9.6*  HCT 30.7* 33.0* 30.2*  MCV 85.0  --  86.0  PLT 345  --  374    Recent Results (from the past 240 hour(s))  Blood Culture (routine x 2)     Status: None (Preliminary result)   Collection Time: 04/14/15 12:11 AM  Result Value Ref Range Status   Specimen Description BLOOD RIGHT ANTECUBITAL  Final   Special Requests BOTTLES DRAWN AEROBIC AND ANAEROBIC 5CC  Final   Culture   Final    NO GROWTH 1 DAY Performed at [facility]    Report Status PENDING  Incomplete  Blood Culture (routine x 2)     Status: None (Preliminary result)   Collection Time: 04/14/15 12:30 AM  Result Value Ref Range Status   Specimen Description BLOOD RIGHT HAND  Final   Special Requests BOTTLES DRAWN AEROBIC AND ANAEROBIC 4CC  Final   Culture   Final    NO GROWTH 1 DAY Performed at [facility]    Report Status PENDING  Incomplete  Urine culture     Status: None   Collection Time: 04/14/15  1:12 AM  Result Value Ref Range Status   Specimen Description URINE, CLEAN CATCH  Final   Special Requests NONE  Final   Culture   Final    4,000 COLONIES/mL INSIGNIFICANT GROWTH Performed at [facility]    Report Status 04/15/2015 FINAL  Final  Culture, body fluid-bottle     Status: None (Preliminary result)   Collection Time: 04/14/15 10:16 AM  Result Value Ref Range Status   Specimen Description FLUID RIGHT PLEURAL  Final   Special Requests BOTTLES DRAWN AEROBIC AND ANAEROBIC 10CCS  Final   Culture   Final    NO GROWTH 1 DAY Performed at [facility]    Report Status PENDING  Incomplete  Gram stain     Status: None   Collection Time: 04/14/15 10:16 AM  Result Value Ref Range Status   Specimen Description FLUID RIGHT PLEURAL  Final   Special Requests NONE  Final   Gram Stain   Final    FEW WBC PRESENT,BOTH PMN AND MONONUCLEAR NO ORGANISMS SEEN Performed at [facility]    Report Status 04/14/2015 FINAL  Final     Scheduled Meds: . docusate sodium  100 mg Oral BID  . fentaNYL  25 mcg Transdermal Q3 days  . pantoprazole  40 mg Oral Daily  . piperacillin-tazobactam (ZOSYN)  IV  3.375 g Intravenous 3 times per day  . sodium chloride flush  3 mL Intravenous Q12H  . vancomycin  750 mg Intravenous Q8H   Continuous Infusions: . 0.9 % NaCl with KCl 40 mEq / L 100 mL/hr (04/15/15 1039)

## 2015-04-15 NOTE — Progress Notes (Signed)
PCCM Interval Note  Her pleural fluid is monocyte predominant exudate, total WBC 845. Concerning for malignant effusion vs also consider parapneumonic or chronic due to nutritional status. Will follow for cytology and cx results.    Baltazar Apo, MD, PhD 04/15/2015, 10:11 AM [facility] [phone] or if no answer [phone]

## 2015-04-16 LAB — BASIC METABOLIC PANEL
Anion gap: 8 (ref 5–15)
BUN: <5 mg/dL — ABNORMAL LOW (ref 6–20)
CHLORIDE: 108 mmol/L (ref 101–111)
CO2: 23 mmol/L (ref 22–32)
CREATININE: 0.86 mg/dL (ref 0.44–1.00)
Calcium: 8.6 mg/dL — ABNORMAL LOW (ref 8.9–10.3)
GFR calc non Af Amer: >60 mL/min (ref >60)
GLUCOSE: 90 mg/dL (ref 65–99)
Potassium: 5.1 mmol/L (ref 3.5–5.1)
Sodium: 139 mmol/L (ref 135–145)

## 2015-04-16 LAB — CBC
HEMATOCRIT: 32.5 % — AB (ref 36.0–46.0)
HEMOGLOBIN: 10.4 g/dL — AB (ref 12.0–15.0)
MCH: 27.2 pg (ref 26.0–34.0)
MCHC: 32 g/dL (ref 30.0–36.0)
MCV: 84.9 fL (ref 78.0–100.0)
Platelets: 360 10*3/uL (ref 150–400)
RBC: 3.83 MIL/uL — ABNORMAL LOW (ref 3.87–5.11)
RDW: 16.7 % — ABNORMAL HIGH (ref 11.5–15.5)
WBC: 6.7 10*3/uL (ref 4.0–10.5)

## 2015-04-16 LAB — VANCOMYCIN, TROUGH: Vancomycin Tr: 28 ug/mL (ref 10.0–20.0)

## 2015-04-16 MED ORDER — VANCOMYCIN HCL IN DEXTROSE 750-5 MG/150ML-% IV SOLN
750.0000 mg | Freq: Two times a day (BID) | INTRAVENOUS | Status: DC
  Filled 2015-04-16: qty 150

## 2015-04-16 NOTE — Progress Notes (Signed)
Pharmacy Antibiotic Note  Beth Harrison is a 45 y.o. female admitted on 04/05/2015 with sepsis.  Pharmacy has been consulted for Vancomycin dosing.  Plan: The dose of Vancomycin  will be adjusted to 750mg  iv q12hr, with next dose at 1800 based on renal function.  Height: 5' 5.5" (166.4 cm) Weight: 152 lb 8.9 oz (69.2 kg) IBW/kg (Calculated) : 58.15  Temp (24hrs), Avg:97.9 F (36.6 C), Min:97.6 F (36.4 C), Max:98.2 F (36.8 C)   Recent Labs Lab 04/14/15 0011 04/14/15 0029 04/14/15 0249 04/14/15 1132 04/14/15 1716 04/15/15 0436 04/16/15 0526  WBC 7.1  --   --   --   --  6.2 6.7  CREATININE 0.96 0.80  --   --   --  0.56 0.86  LATICACIDVEN  --  2.44* 1.02 1.4 1.1  --   --   VANCOTROUGH  --   --   --   --   --   --  28*    Estimated Creatinine Clearance: 78.3 mL/min (by C-G formula based on Cr of 0.86).    Allergies  Allergen Reactions  . Ciprofloxacin Hives  . Doxycycline Nausea And Vomiting    Antimicrobials this admission: Vancomycin 2/17 >>    Dose adjustments this admission: 2/19--change vancomycin to 750mg  iv q12hr after vancomycin trough of 28 on 750mg  iv q8hr   Thank you for allowing pharmacy to be a part of this patient's care.  Beth Harrison 04/16/2015 6:22 AM

## 2015-04-16 NOTE — Progress Notes (Signed)
Vanc trough of 28 called to pharmacy.

## 2015-04-16 NOTE — Progress Notes (Addendum)
Patient ID: Beth Harrison, female   DOB: 1970-07-26, 45 y.o.   MRN: [id number]  [facility] PROGRESS NOTE  Tanja Dever [id number] DOB: 15-Dec-1970 DOA: 04/03/2015 PCP: [name], MD   Brief narrative:    45 year old female with a history of gastric cancer, diagnosed 06/28/14, metastatic to left subclavicular, mediastinal and pelvic lymph nodes, prior history of refractory nausea and gastric outlet obstruction, started chemotherapy June 2016 last chemotherapy 04/04/15, seen by her oncologist on 04/11/15 at [place]. Reportedly continues to have more nausea and fatigue from her last chemotherapy cycle. Refused chemotherapy on 2/14, due to decreased appetite and weight loss. Patient has also noticed low-grade fevers for the last 1 week. Hospitalized for pneumonia 03/27/15. CT scan done on 03/01/15 did not show pulmonary embolism but showed moderate bilateral pleural effusions with scattered groundglass appearance. Chest x-ray on 04/11/15 showed left IJ Port-A-Cath with interval increase in right pleural effusion and increased by basilar opacities representing atelectasis versus pneumonia versus aspiration for which she was prescribed doxycycline by her oncologist Dr. [name].  Assessment/Plan:    Principal Problem:   Acute hypoxemic respiratory failure (HCC) - secondary to pleural effusions, ? HCAP - s/p therapeutic, diagnostic thoracentesis, post procedure day #2, preliminary findings concerning for malignant effusion - pt reports only mild exertional dyspnea this AM but says she feels better  - suspect we can stop vancomycin today as pt afebrile, no leukocytosis, continue zosyn for now and narrow down in next 24 hours  - continue supportive care  - follow up on fluid analysis   Active Problems:   Hypokalemia, Hypomagnesemia  - supplemented and WNL - check Mg level in AM    Hyponatremia - mild, resolved     Gastric cancer - follow up with Dr. [name] at [facility]   DVT prophylaxis - SCD's  Code Status: Full.  Family Communication:  plan of care discussed with the patient and mother at bedside  Disposition Plan: Home by 2/22  IV access:  Peripheral IV  Procedures and diagnostic studies:    Dg Chest 1 View 04/14/2015 Right pleural effusion is significantly smaller status post thoracentesis. No pneumothorax is noted.   Ct Angio Chest Pe W/cm &/or Wo Cm 04/14/2015 No evidence of significant pulmonary embolus. Moderate-sized enlarging bilateral pleural effusions with basilar atelectasis or consolidation.   Dg Chest Port 1 View 04/02/2015  Bilateral pleural effusions with basilar atelectasis or infiltration, greater on the right.  US Thoracentesis Asp Pleural Space W/img Guide 04/14/2015  Successful ultrasound guided right thoracentesis yielding 1 L of pleural fluid. Read by: Ascencion Dike PA-C   Medical Consultants:  PCCM IR  Other Consultants:  None  IAnti-Infectives:   Vancomycin 2/17 --> 2/19 Zosyn 2/17 -->  Faye Ramsay, MD  [facility] Pager [phone]  If 7PM-7AM, please contact night-coverage [email or web address] Password [email or web address] 04/16/2015, 5:02 PM   LOS: 2 days   HPI/Subjective: No events overnight.   Objective: Filed Vitals:   04/15/15 1400 04/15/15 2300 04/16/15 0624 04/16/15 1500  BP: 101/76 111/84 120/84 120/96  Pulse: 123 119 120 122  Temp: 98.2 F (36.8 C) 98 F (36.7 C) 97.5 F (36.4 C) 98 F (36.7 C)  TempSrc: Oral Oral Oral Oral  Resp: 18 18 18 18   Height:      Weight:      SpO2: 100% 96% 92% 92%    Intake/Output Summary (Last 24 hours) at 04/16/15 1702 Last data filed at 04/16/15 1030  Gross per 24 hour  Intake  610 ml  Output      0 ml  Net    610 ml    Exam:   General:  Pt is alert, follows commands appropriately, not in acute distress  Cardiovascular: Regular rhythm, tachycardic, S1/S2, no murmurs, no rubs, no gallops  Respiratory: diminished breath sounds at bases   Abdomen: Soft, non  tender, non distended, bowel sounds present, no guarding  Data Reviewed: Basic Metabolic Panel:  Recent Labs Lab 04/14/15 0011 04/14/15 0029 04/14/15 0806 04/15/15 0436 04/16/15 0526  NA 130* 131*  --  134* 139  K 2.4* 2.4*  --  4.5 5.1  CL 92* 90*  --  105 108  CO2 25  --   --  24 23  GLUCOSE 123* 118*  --  96 90  BUN 7 5*  --  <5* <5*  CREATININE 0.96 0.80  --  0.56 0.86  CALCIUM 7.5*  --   --  7.9* 8.6*  MG  --   --  1.4*  --   --    Liver Function Tests:  Recent Labs Lab 04/14/15 0011 04/15/15 0436  AST 16 17  ALT 8* 10*  ALKPHOS 86 82  BILITOT 0.7 0.4  PROT 5.8* 5.7*  ALBUMIN 2.2* 2.1*   CBC:  Recent Labs Lab 04/14/15 0011 04/14/15 0029 04/15/15 0436 04/16/15 0526  WBC 7.1  --  6.2 6.7  NEUTROABS 5.5  --   --   --   HGB 9.7* 11.2* 9.6* 10.4*  HCT 30.7* 33.0* 30.2* 32.5*  MCV 85.0  --  86.0 84.9  PLT 345  --  374 360    Recent Results (from the past 240 hour(s))  Blood Culture (routine x 2)     Status: None (Preliminary result)   Collection Time: 04/14/15 12:11 AM  Result Value Ref Range Status   Specimen Description BLOOD RIGHT ANTECUBITAL  Final   Special Requests BOTTLES DRAWN AEROBIC AND ANAEROBIC 5CC  Final   Culture   Final    NO GROWTH 1 DAY Performed at [facility]    Report Status PENDING  Incomplete  Blood Culture (routine x 2)     Status: None (Preliminary result)   Collection Time: 04/14/15 12:30 AM  Result Value Ref Range Status   Specimen Description BLOOD RIGHT HAND  Final   Special Requests BOTTLES DRAWN AEROBIC AND ANAEROBIC 4CC  Final   Culture   Final    NO GROWTH 1 DAY Performed at [facility]    Report Status PENDING  Incomplete  Urine culture     Status: None   Collection Time: 04/14/15  1:12 AM  Result Value Ref Range Status   Specimen Description URINE, CLEAN CATCH  Final   Special Requests NONE  Final   Culture   Final    4,000 COLONIES/mL INSIGNIFICANT GROWTH Performed at [facility]     Report Status 04/15/2015 FINAL  Final  Culture, body fluid-bottle     Status: None (Preliminary result)   Collection Time: 04/14/15 10:16 AM  Result Value Ref Range Status   Specimen Description FLUID RIGHT PLEURAL  Final   Special Requests BOTTLES DRAWN AEROBIC AND ANAEROBIC 10CCS  Final   Culture   Final    NO GROWTH 1 DAY Performed at [facility]    Report Status PENDING  Incomplete  Gram stain     Status: None   Collection Time: 04/14/15 10:16 AM  Result Value Ref Range Status  Specimen Description FLUID RIGHT PLEURAL  Final   Special Requests NONE  Final   Gram Stain   Final    FEW WBC PRESENT,BOTH PMN AND MONONUCLEAR NO ORGANISMS SEEN Performed at [facility]    Report Status 04/14/2015 FINAL  Final     Scheduled Meds: . docusate sodium  100 mg Oral BID  . fentaNYL  25 mcg Transdermal Q3 days  . pantoprazole  40 mg Oral Daily  . piperacillin-tazobactam (ZOSYN)  IV  3.375 g Intravenous 3 times per day  . sodium chloride flush  3 mL Intravenous Q12H  . vancomycin  750 mg Intravenous Q12H   Continuous Infusions:

## 2015-04-17 ENCOUNTER — Inpatient Hospital Stay (HOSPITAL_COMMUNITY): Payer: BLUE CROSS/BLUE SHIELD | Admitting: Registered Nurse

## 2015-04-17 ENCOUNTER — Inpatient Hospital Stay (HOSPITAL_COMMUNITY): Payer: BLUE CROSS/BLUE SHIELD

## 2015-04-17 LAB — MAGNESIUM: Magnesium: 2.1 mg/dL (ref 1.7–2.4)

## 2015-04-17 LAB — BASIC METABOLIC PANEL
ANION GAP: 10 (ref 5–15)
BUN: 7 mg/dL (ref 6–20)
CALCIUM: 8.7 mg/dL — AB (ref 8.9–10.3)
CO2: 21 mmol/L — ABNORMAL LOW (ref 22–32)
Chloride: 105 mmol/L (ref 101–111)
Creatinine, Ser: 1.03 mg/dL — ABNORMAL HIGH (ref 0.44–1.00)
GFR calc Af Amer: >60 mL/min (ref >60)
GLUCOSE: 106 mg/dL — AB (ref 65–99)
Potassium: 4.6 mmol/L (ref 3.5–5.1)
Sodium: 136 mmol/L (ref 135–145)

## 2015-04-17 LAB — CBC
HCT: 35.5 % — ABNORMAL LOW (ref 36.0–46.0)
Hemoglobin: 11 g/dL — ABNORMAL LOW (ref 12.0–15.0)
MCH: 26.5 pg (ref 26.0–34.0)
MCHC: 31 g/dL (ref 30.0–36.0)
MCV: 85.5 fL (ref 78.0–100.0)
PLATELETS: 346 10*3/uL (ref 150–400)
RBC: 4.15 MIL/uL (ref 3.87–5.11)
RDW: 16.9 % — AB (ref 11.5–15.5)
WBC: 7.4 10*3/uL (ref 4.0–10.5)

## 2015-04-17 LAB — PH, BODY FLUID: pH, Body Fluid: 7.9

## 2015-04-17 MED ORDER — FENTANYL 75 MCG/HR TD PT72: 75.0000 ug | MEDICATED_PATCH | TRANSDERMAL | Status: DC

## 2015-04-17 MED ORDER — METOPROLOL TARTRATE 1 MG/ML IV SOLN: 5.0000 mg | Freq: Four times a day (QID) | INTRAVENOUS | Status: DC | PRN

## 2015-04-17 NOTE — Progress Notes (Signed)
[facility] PCCM  Chart reviewed, still awaiting pleural cytology results Will come back when those are finalized.  Roselie Awkward, MD [place] P[facility]M Pager: [phone] Cell: [phone] After 3pm or if no response, call [phone]

## 2015-04-17 NOTE — Progress Notes (Addendum)
Patient ID: Beth Harrison, female   DOB: [birth year], 45 y.o.   MRN: [id number]  [facility] PROGRESS NOTE  Beth Harrison [id number] DOB: [birth year] DOA: 04/17/2015 PCP: [name], MD   Brief narrative:    45 year old female with a history of gastric cancer, diagnosed 06/28/14, metastatic to left subclavicular, mediastinal and pelvic lymph nodes, prior history of refractory nausea and gastric outlet obstruction, started chemotherapy June 2016 last chemotherapy 04/04/15, seen by her oncologist on 04/11/15 at [place]. Reportedly continues to have more nausea and fatigue from her last chemotherapy cycle. Refused chemotherapy on 2/14, due to decreased appetite and weight loss. Patient has also noticed low-grade fevers for the last 1 week. Hospitalized for pneumonia 03/27/15. CT scan done on 03/01/15 did not show pulmonary embolism but showed moderate bilateral pleural effusions with scattered groundglass appearance. Chest x-ray on 04/11/15 showed left IJ Port-A-Cath with interval increase in right pleural effusion and increased by basilar opacities representing atelectasis versus pneumonia versus aspiration for which she was prescribed doxycycline by her oncologist Dr. [name].  Assessment/Plan:    Principal Problem:   Acute hypoxemic respiratory failure (HCC) - secondary to pleural effusions, ? HCAP - s/p therapeutic, diagnostic thoracentesis, post procedure day #3, preliminary findings concerning for malignant effusion - pt reports only mild exertional dyspnea this AM, repeat CXR for follow up - today is Zosyn day #5, suspect we can narrow down to oral ABX if PCCM of with that  - continue supportive care  - follow up on fluid analysis   Active Problems:   Hypokalemia, Hypomagnesemia  - supplemented and WNL    Tachycardia - likely reactive - add Metoprolol IV as needed     Hyponatremia - mild, resolved     Gastric cancer - follow up with [facility]. [name] at [facility]   DVT prophylaxis - SCD's  Code Status: Full.  Family Communication:  plan of care discussed with the patient and mother at bedside  Disposition Plan: Home by 2/22  IV access:  Peripheral IV  Procedures and diagnostic studies:    Dg Chest 1 View 04/14/2015 Right pleural effusion is significantly smaller status post thoracentesis. No pneumothorax is noted.   Ct Angio Chest Pe W/cm &/or Wo Cm 04/14/2015 No evidence of significant pulmonary embolus. Moderate-sized enlarging bilateral pleural effusions with basilar atelectasis or consolidation.   Dg Chest Port 1 View 04/16/2015  Bilateral pleural effusions with basilar atelectasis or infiltration, greater on the right.  US Thoracentesis Asp Pleural Space W/img Guide 04/14/2015  Successful ultrasound guided right thoracentesis yielding 1 L of pleural fluid. Read by: Ascencion Dike PA-C   Medical Consultants:  PCCM IR  Other Consultants:  None  IAnti-Infectives:   Vancomycin 2/17 --> 2/19 Zosyn 2/17 -->  Faye Ramsay, MD  [facility] Pager [phone]  If 7PM-7AM, please contact night-coverage [email or web address] Password [facility] 04/17/2015, 11:46 AM   LOS: 3 days   HPI/Subjective: No events overnight.   Objective: Filed Vitals:   04/16/15 0624 04/16/15 1500 04/16/15 2103 04/17/15 0540  BP: 120/84 120/96 123/86 107/83  Pulse: 120 122 128 125  Temp: 97.5 F (36.4 C) 98 F (36.7 C) 97.5 F (36.4 C) 97.8 F (36.6 C)  TempSrc: Oral Oral Oral Oral  Resp: 18 18 18 18   Height:      Weight:      SpO2: 92% 92% 98% 97%    Intake/Output Summary (Last 24 hours) at 04/17/15 1146 Last data filed at 04/17/15 0535  Gross per 24 hour  Intake  150 ml  Output      0 ml  Net    150 ml    Exam:   General:  Pt is alert, follows commands appropriately, not in acute distress  Cardiovascular: Regular rhythm, tachycardic, S1/S2, no murmurs, no rubs, no gallops  Respiratory: diminished breath sounds at bases   Abdomen: Soft,  non tender, non distended, bowel sounds present, no guarding  Data Reviewed: Basic Metabolic Panel:  Recent Labs Lab 04/14/15 0011 04/14/15 0029 04/14/15 0806 04/15/15 0436 04/16/15 0526 04/17/15 0412  NA 130* 131*  --  134* 139 136  K 2.4* 2.4*  --  4.5 5.1 4.6  CL 92* 90*  --  105 108 105  CO2 25  --   --  24 23 21*  GLUCOSE 123* 118*  --  96 90 106*  BUN 7 5*  --  <5* <5* 7  CREATININE 0.96 0.80  --  0.56 0.86 1.03*  CALCIUM 7.5*  --   --  7.9* 8.6* 8.7*  MG  --   --  1.4*  --   --  2.1   Liver Function Tests:  Recent Labs Lab 04/14/15 0011 04/15/15 0436  AST 16 17  ALT 8* 10*  ALKPHOS 86 82  BILITOT 0.7 0.4  PROT 5.8* 5.7*  ALBUMIN 2.2* 2.1*   CBC:  Recent Labs Lab 04/14/15 0011 04/14/15 0029 04/15/15 0436 04/16/15 0526 04/17/15 0412  WBC 7.1  --  6.2 6.7 7.4  NEUTROABS 5.5  --   --   --   --   HGB 9.7* 11.2* 9.6* 10.4* 11.0*  HCT 30.7* 33.0* 30.2* 32.5* 35.5*  MCV 85.0  --  86.0 84.9 85.5  PLT 345  --  374 360 346    Recent Results (from the past 240 hour(s))  Blood Culture (routine x 2)     Status: None (Preliminary result)   Collection Time: 04/14/15 12:11 AM  Result Value Ref Range Status   Specimen Description BLOOD RIGHT ANTECUBITAL  Final   Special Requests BOTTLES DRAWN AEROBIC AND ANAEROBIC 5CC  Final   Culture   Final    NO GROWTH 2 DAYS Performed at [facility]    Report Status PENDING  Incomplete  Blood Culture (routine x 2)     Status: None (Preliminary result)   Collection Time: 04/14/15 12:30 AM  Result Value Ref Range Status   Specimen Description BLOOD RIGHT HAND  Final   Special Requests BOTTLES DRAWN AEROBIC AND ANAEROBIC 4CC  Final   Culture   Final    NO GROWTH 2 DAYS Performed at [facility]    Report Status PENDING  Incomplete  Urine culture     Status: None   Collection Time: 04/14/15  1:12 AM  Result Value Ref Range Status   Specimen Description URINE, CLEAN CATCH  Final   Special Requests NONE   Final   Culture   Final    4,000 COLONIES/mL INSIGNIFICANT GROWTH Performed at [facility]    Report Status 04/15/2015 FINAL  Final  Culture, body fluid-bottle     Status: None (Preliminary result)   Collection Time: 04/14/15 10:16 AM  Result Value Ref Range Status   Specimen Description FLUID RIGHT PLEURAL  Final   Special Requests BOTTLES DRAWN AEROBIC AND ANAEROBIC 10CCS  Final   Culture   Final    NO GROWTH 2 DAYS Performed at [facility]    Report Status PENDING  Incomplete  Gram  stain     Status: None   Collection Time: 04/14/15 10:16 AM  Result Value Ref Range Status   Specimen Description FLUID RIGHT PLEURAL  Final   Special Requests NONE  Final   Gram Stain   Final    FEW WBC PRESENT,BOTH PMN AND MONONUCLEAR NO ORGANISMS SEEN Performed at [facility]    Report Status 04/14/2015 FINAL  Final     Scheduled Meds: . docusate sodium  100 mg Oral BID  . [START ON 04/20/2015] fentaNYL  75 mcg Transdermal Q3 days  . pantoprazole  40 mg Oral Daily  . piperacillin-tazobactam (ZOSYN)  IV  3.375 g Intravenous 3 times per day  . sodium chloride flush  3 mL Intravenous Q12H

## 2015-04-18 LAB — GLUCOSE, CAPILLARY: GLUCOSE-CAPILLARY: 103 mg/dL — AB (ref 65–99)

## 2015-04-18 MED ORDER — NALOXONE HCL 0.4 MG/ML IJ SOLN
INTRAMUSCULAR | Status: AC
  Filled 2015-04-18: qty 2

## 2015-04-18 MED FILL — Medication: Qty: 1 | Status: AC

## 2015-04-19 LAB — CULTURE, BLOOD (ROUTINE X 2)
CULTURE: NO GROWTH
CULTURE: NO GROWTH

## 2015-04-19 LAB — CULTURE, BODY FLUID-BOTTLE

## 2015-04-19 LAB — CULTURE, BODY FLUID W GRAM STAIN -BOTTLE: Culture: NO GROWTH

## 2015-04-26 NOTE — Anesthesia Procedure Notes (Signed)
Procedure Name: Intubation Date/Time: 04/17/2015 11:54 PM Performed by: Carleene Stabler A Pre-anesthesia Checklist: Patient identified, Emergency Drugs available, Suction available, Patient being monitored and Timeout performed Patient Re-evaluated:Patient Re-evaluated prior to inductionPreoxygenation: Pre-oxygenation with 100% oxygen Laryngoscope Size: Mac and 4 Grade View: Grade I Tube type: Subglottic suction tube Tube size: 7.5 mm Number of attempts: 1 Airway Equipment and Method: Stylet Placement Confirmation: ETT inserted through vocal cords under direct vision,  CO2 detector and breath sounds checked- equal and bilateral Secured at: 21 cm Tube secured with: Tape Dental Injury: Teeth and Oropharynx as per pre-operative assessment

## 2015-04-26 NOTE — Discharge Summary (Addendum)
  Death Summary  Beth Harrison [id number] DOB: 10/10/1970 DOA: 04-16-15  PCP: Beth Seashore, MD PCP/Office notified:   Admit date: Apr 16, 2015 Date of Death: 2015/04/21  Final Diagnoses:  Principal Problem:   Acute hypoxemic respiratory failure ([place]) Active Problems:   Malignant neoplasm of stomach (HCC)   Pleural effusion, bilateral   Parapneumonic effusion   Hypokalemia   Hyponatremia   Brief narrative:    45 year old female with a history of gastric cancer, diagnosed 06/28/14, metastatic to left subclavicular, mediastinal and pelvic lymph nodes, prior history of refractory nausea and gastric outlet obstruction, started chemotherapy June 2016 last chemotherapy 04/04/15, seen by her oncologist on 04/11/15 at [place]. Reportedly continues to have more nausea and fatigue from her last chemotherapy cycle. Refused chemotherapy on 2/14, due to decreased appetite and weight loss. Patient has also noticed low-grade fevers for the last 1 week. Hospitalized for pneumonia 03/27/15. CT scan done on 03/01/15 did not show pulmonary embolism but showed moderate bilateral pleural effusions with scattered groundglass appearance. Chest x-ray on 04/11/15 showed left IJ Port-A-Cath with interval increase in right pleural effusion and increased by basilar opacities representing atelectasis versus pneumonia versus aspiration for which she was prescribed doxycycline by her oncologist Dr. Nelda Harrison.  Assessment/Plan:    Principal Problem:  Acute hypoxemic respiratory failure ([place]) / PEA / cardiac arrest  - secondary to pleural effusions, ? HCAP - s/p therapeutic, diagnostic thoracentesis, initial fluid analysis consistent with malignant pleural effusion and cytology report from pleural fluid analysis confirmed metastatic adenocarcinoma  - pt was also started on broad spectrum ABX vancomycin and zosyn to cover for possible HCAP, vancomycin was stopped after third day  - pt apparently aspirated  overnight and went into PEA, code blue called and pt intubated but unfortunately with no return of spontaneous circulation - cause of death determined to be cardiac arrest post aspiration in the setting of vomiting - though was given to PE as well as it was noted RLE swelling, CT chest angio on admission was negative for PE   Active Problems:  Hypokalemia, Hypomagnesemia  - supplemented during hospital stay    Tachycardia - though to be reactive and due to pain - pt has not reported any chest pain and her dyspnea has actually improved post thoracentesis  - metoprolol as needed provided for HR > 115   Hyponatremia - resolved    Gastric cancer - metastatic   IV access:  Peripheral IV  Procedures and diagnostic studies:   Dg Chest 1 View 04/14/2015 Right pleural effusion is significantly smaller status post thoracentesis. No pneumothorax is noted.   Ct Angio Chest Pe W/cm &/or Wo Cm 04/14/2015 No evidence of significant pulmonary embolus. Moderate-sized enlarging bilateral pleural effusions with basilar atelectasis or consolidation.   Dg Chest Port 1 View 04-16-15 Bilateral pleural effusions with basilar atelectasis or infiltration, greater on the right.  US Thoracentesis Asp Pleural Space W/img Guide 04/14/2015 Successful ultrasound guided right thoracentesis yielding 1 L of pleural fluid. Read by: Beth Dike PA-C   Medical Consultants:  PCCM IR  Other Consultants:  None  IAnti-Infectives:   Vancomycin 2/17 --> 2/19 Zosyn 2/17 -->      Signed:  MAGICK-Roslind Harrison  [facility] 04/19/2015, 8:40 PM [phone] Cell [phone]

## 2015-04-26 NOTE — Progress Notes (Signed)
Patient was found in bed,  Nurse Tech (NT) stated that patient was sitting on the edge of the bed and slumped back on to the bed, and (NT)  called out to the writer. Pt was not verbally response, but opened her eyes  in response to  tactile stimulation, patient had a pulse,  slowed gasping  respirations. RN called a Code Blue . Patient mother was present at bedside during and prior to code.

## 2015-04-26 NOTE — Progress Notes (Addendum)
CODE BLUE called and CPR begun at 2351 hours. Code ran by Dr. Randal Buba of ED. ACLS stopped at 0021 am and pt pronouned dead at that time by Dr. Randal Buba. Pt's mother was at bedside during code and actions explained to her. Support given to mother by staff. Husband was on his way to hospital. This NP spoke to husband over the phone and explained that we had been performing CPR for over 20 mins (at the time of conversation) and would likely not revive his wife. He was still 15 mins away from hospital. He hung up on this NP. Info related to Dr. Randal Buba and after 10 more minutes of CPR without SROC, code was stopped. Pt's mother understood and said "you did all you could". Husband arrived at hospital later and was spoken to by staff and NP. Wanted time alone before deciding on desire for autopsy. Will await his decision and fill out death certificate at that time. Family declined chaplain.  Called back to unit by family. Request autopsy. Again, declined chaplain. Death certificate completed as much as possible and not certified due to autopsy request.   Clance Boll, NP [facility]

## 2015-04-26 NOTE — ED Provider Notes (Signed)
By signing my name below, I, Altamease Oiler, attest that this documentation has been prepared under the direction and in the presence of Zacary Bauer MD Electronically Signed: Altamease Oiler, ED Scribe. 05/12/2015. 1:51 AM  [facility]  Department of Emergency Medicine   Code Blue CONSULT NOTE  Chief Complaint: Cardiac arrest/unresponsive   Level V Caveat: Unresponsive  History of present illness: I was contacted by the hospital for a CODE BLUE cardiac arrest upstairs and presented to the patient's bedside. Per nursing staff the pt vomited and then 45 minutes later was found to be unresponsive with agonal breathing.   ROS: Unable to obtain, Level V caveat  Scheduled Meds: . docusate sodium  100 mg Oral BID  . [START ON 04/20/2015] fentaNYL  75 mcg Transdermal Q3 days  . naloxone      . pantoprazole  40 mg Oral Daily  . piperacillin-tazobactam (ZOSYN)  IV  3.375 g Intravenous 3 times per day  . sodium chloride flush  3 mL Intravenous Q12H   Continuous Infusions:  PRN Meds:.acetaminophen **OR** acetaminophen, HYDROmorphone (DILAUDID) injection, ketorolac, levalbuterol, metoprolol, morphine injection, ondansetron **OR** ondansetron (ZOFRAN) IV, oxyCODONE, sodium chloride flush, zolpidem Past Medical History  Diagnosis Date  . Varicose vein   . Chlamydia     h/o positive  . Stomach cancer [facility])     diag 4/16-on 9th round of chemo at [facility]  . 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 Cesarean section      x 2  . Breast reduction surgery      bilateral  . Portacath placement      on right side, area got infected-moved to left side  . Breast lumpectomy  2006    right-fibroadenoma   Social History   Social History  . Marital Status: Married    Spouse Name: N/A  . Number of Children: N/A  . Years of Education: N/A   Occupational History  . Not on file.   Social History Main Topics  . Smoking status: Never  Smoker   . Smokeless tobacco: Never Used  . Alcohol Use: No  . Drug Use: No  . Sexual Activity:    Partners: Male    Birth Control/ Protection:    Other Topics Concern  . Not on file   Social History Narrative   Daily caffeine   Allergies  Allergen Reactions  . Ciprofloxacin Hives  . Doxycycline Nausea And Vomiting    Last set of Vital Signs (not current) Filed Vitals:   04/17/15 1423 04/17/15 2059  BP: 96/65 94/71  Pulse: 125 121  Temp: 97.4 F (36.3 C) 98.6 F (37 C)  Resp: 18 18      Physical Exam  Gen: unresponsive Cardiovascular: pulseless  Resp: apneic. Breath sounds equal bilaterally with bagging  Abd: nondistended  Neuro: GCS 3, unresponsive to pain  HEENT: No blood in posterior pharynx, gag reflex absent  Neck: No crepitus  Musculoskeletal: No deformity  Skin: warm  Procedures   CRITICAL CARE Performed by: Sedra Morfin MD Total critical care time: 31 minutes Critical care time was exclusive of separately billable procedures and treating other patients. Critical care was necessary to treat or prevent imminent or life-threatening deterioration. Critical care was time spent personally by me on the following activities: development of treatment plan with patient and/or surrogate as well as nursing, discussions with consultants, evaluation of patient's response to treatment, examination of patient, obtaining  history from patient or surrogate, ordering and performing treatments and interventions, ordering and review of laboratory studies, ordering and review of radiographic studies, pulse oximetry and re-evaluation of patient's condition.  Cardiopulmonary Resuscitation (CPR) Procedure Note  Directed/Performed by: Quintara Bost MD I personally directed ancillary staff and/or performed CPR in an effort to regain return of spontaneous circulation and to maintain cardiac, neuro and systemic perfusion.   Before termination of code, EDP repeatedly asked staff  if there were any objections  Medical Decision making   Pt given 4.8 of narcan and 4 fentanyl patches were removed. A total of 6 rounds of epinephrine and 2 rounds of bicarb were given before resuscitation was discontinued at 12:21 AM.   DDX of PEA .1 aspiration-- seen by anesthesia on intubation.  Making respiratory arrest the likely cause of the patient's code 2. Pulmonary embolism- swollen left calf with known malignancy 3. Narcotics-- had on 4 fentanyl patches and was given percocet this evening before emesis and respiratory arrest/cardiac arrest.  Fentanyl patches removed by EDP and area swabbed thoroughly with isopropyl alcohol and narcan administered multiple times  Assessment and Plan  PEA to asystole.  Hospitalists to certify death certificate  Spoke with patient's mother, she had no questions   I personally performed the services described in this documentation, which was scribed in my presence. The recorded information has been reviewed and is accurate.     Veatrice Kells, MD 2015/05/04 [phone]

## 2015-04-26 DEATH — deceased

## 2016-03-01 IMAGING — CT CT ABD-PELV W/ CM
1 of 3 series · 13 of 32 positions shown, 18 images · IV contrast (OMNIPAQUE)
Comparison: Multiple exams, including 06/19/2014 and 08/27/2011

CLINICAL DATA: Epigastric pain for 1 month worsening over the past
2 weeks. Endoscopy this morning revealing gastric mass.

EXAM:
CT CHEST, ABDOMEN, AND PELVIS WITH CONTRAST
TECHNIQUE: Multidetector CT imaging of the chest, abdomen and pelvis was
performed following the standard protocol during bolus
administration of intravenous contrast.
CONTRAST:  100mL OMNIPAQUE IOHEXOL 300 MG/ML  SOLN

[Series 2: cap w · axial · 0.92mm/px · z∈[-596,-31]mm · 13 of 129 slices shown, 18 images]
[im 8/129  soft-tissue]
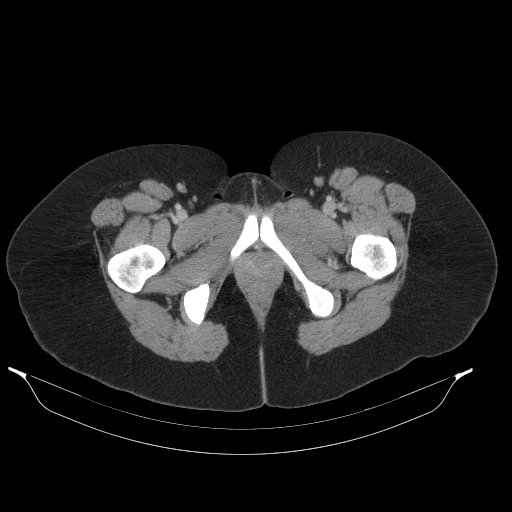
[im 8/129  bone]
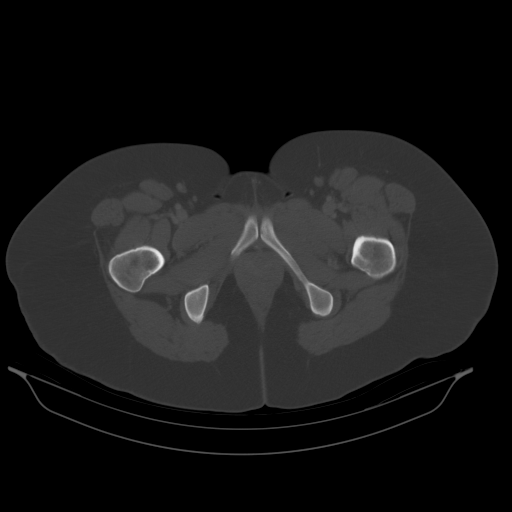
[im 22/129  soft-tissue]
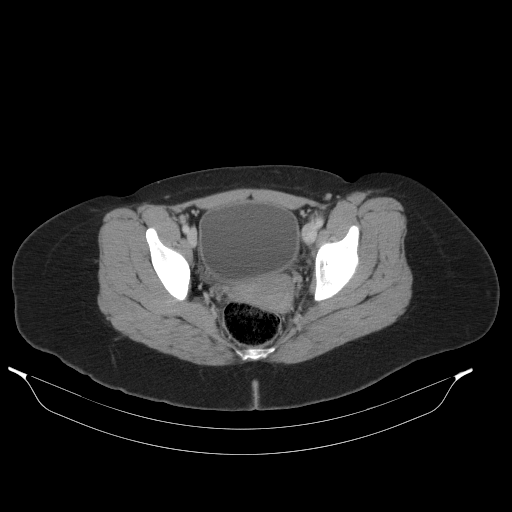
[im 29/129  soft-tissue]
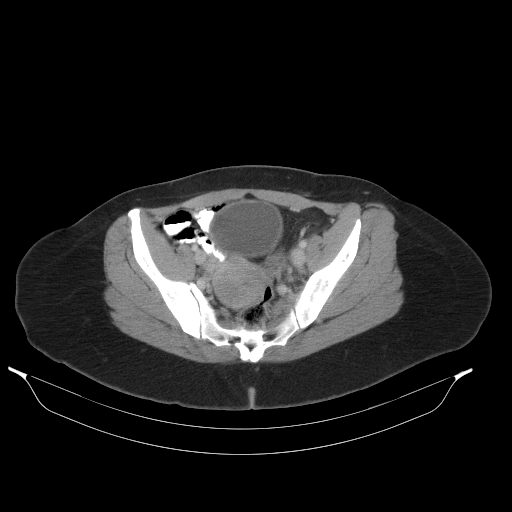
[im 36/129  soft-tissue]
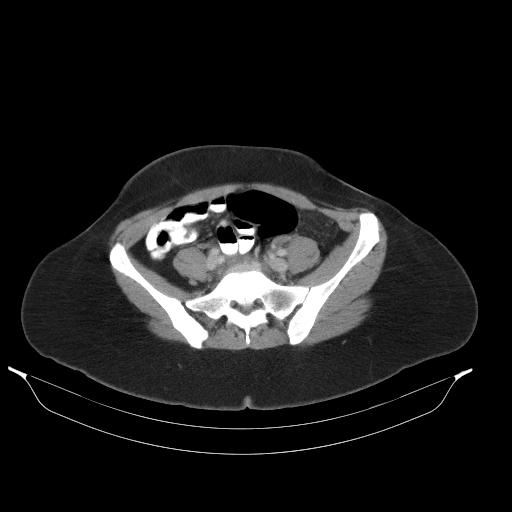
[im 50/129  soft-tissue]
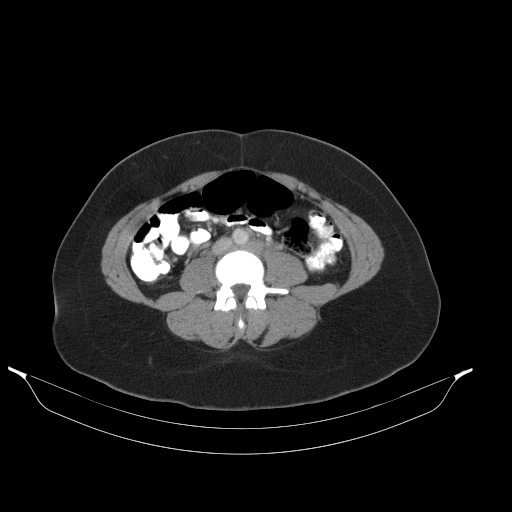
[im 57/129  soft-tissue]
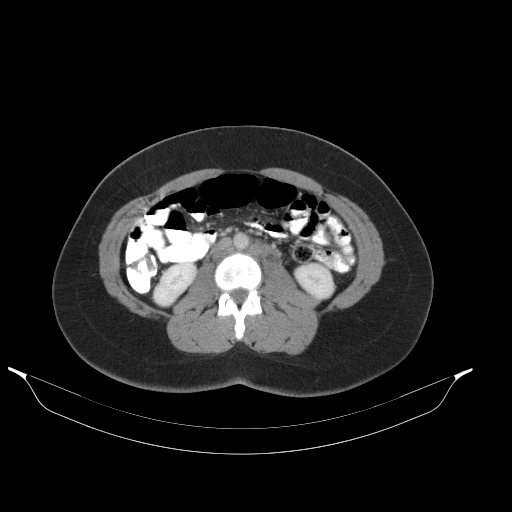
[im 72/129  soft-tissue]
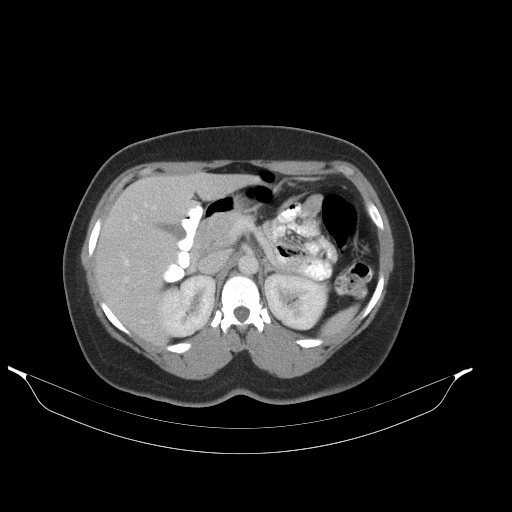
[im 79/129  soft-tissue]
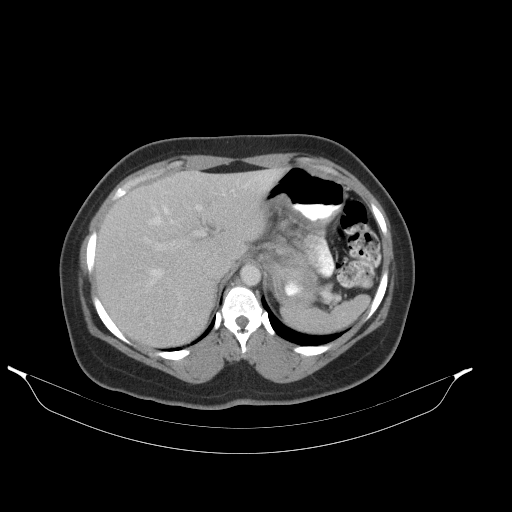
[im 93/129  soft-tissue]
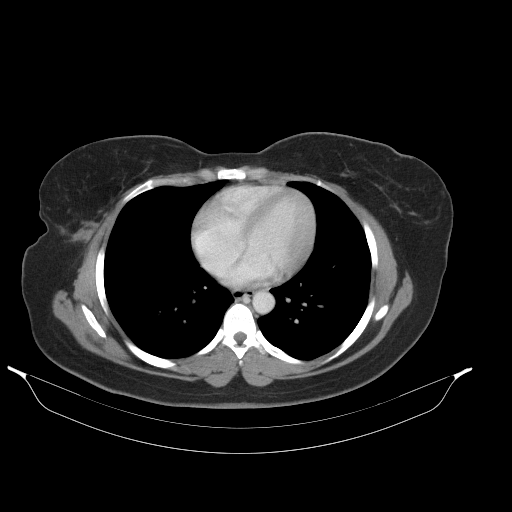
[im 93/129  bone]
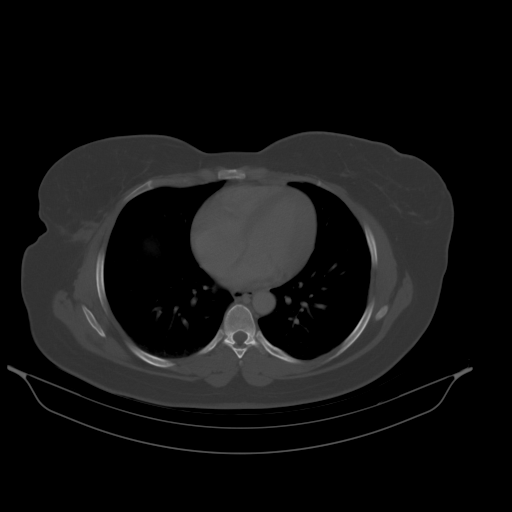
[im 100/129  soft-tissue]
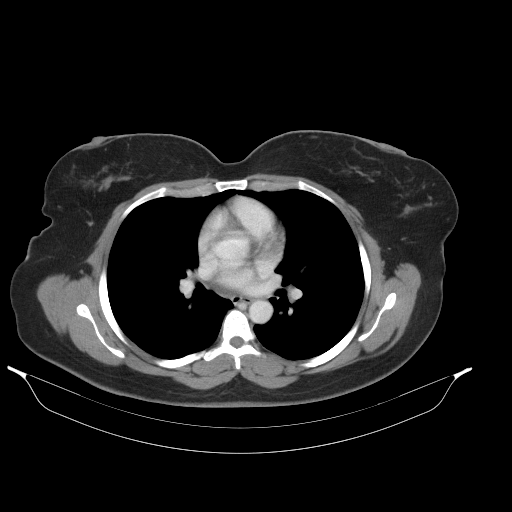
[im 100/129  lung]
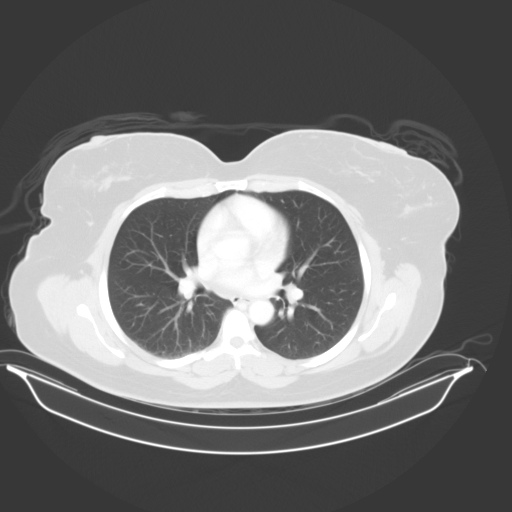
[im 107/129  soft-tissue]
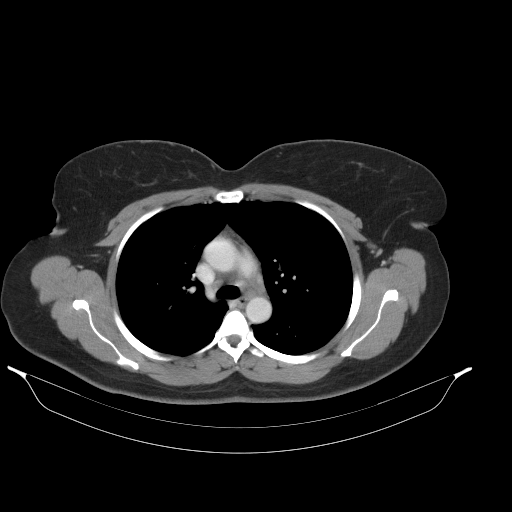
[im 107/129  lung]
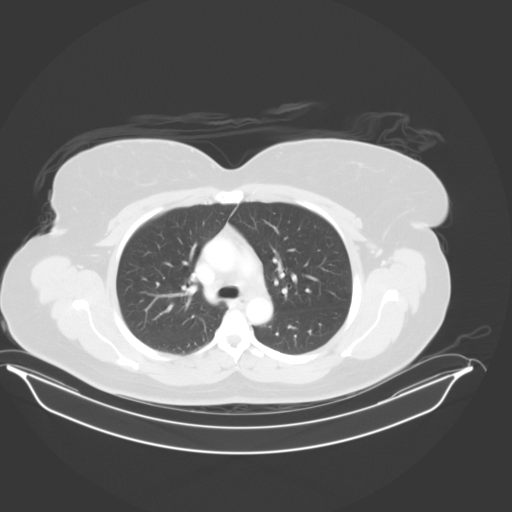
[im 114/129  lung]
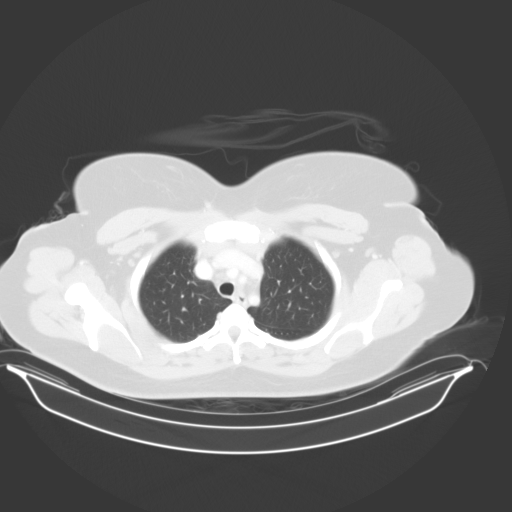
[im 121/129  soft-tissue]
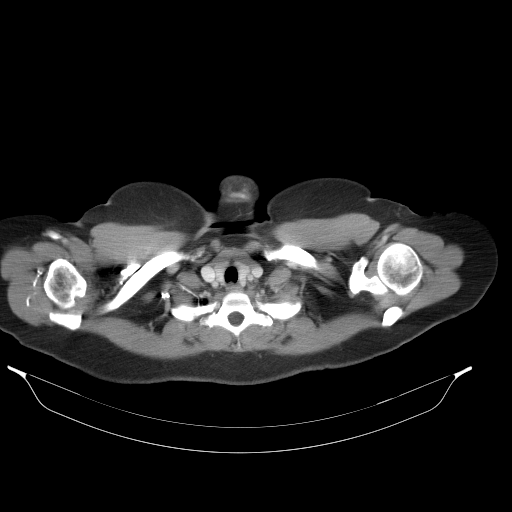
[im 121/129  lung]
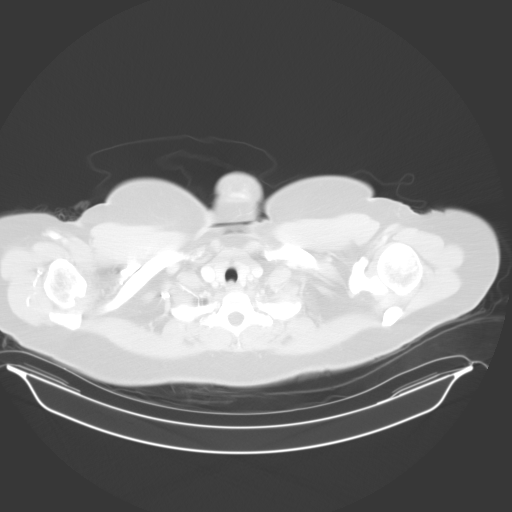

[13 of 32 positions shown; findings below may reference images not displayed]

FINDINGS: CT CHEST FINDINGS

Mediastinum/Nodes: AP window lymph node 0.8 cm in short axis, image
22 series 2, within normal limits. No overtly pathologic thoracic
adenopathy. No significant atherosclerotic calcification observed.

Lungs/Pleura: Unremarkable

Musculoskeletal: Unremarkable

CT ABDOMEN PELVIS FINDINGS

Hepatobiliary: Unremarkable

Pancreas: Unremarkable

Spleen: Unremarkable

Adrenals/Urinary Tract: Unremarkable

Stomach/Bowel: There is generalized wall thickening in the gastric
fundus. A focal mass is difficult to appreciate but I sessile tumor.
Appear this way.

Vascular/Lymphatic: Indistinct gastrohepatic ligament lymph node 1
cm on image 52 of series 2 in short axis. Poorly marginated
retroperitoneal adenopathy includes a 1.3 cm in short axis left
periaortic lymph node on image 74 of series 2, a 0.9 cm retrocaval
node on image 69 of series 2, and retroperitoneal stranding around
the aorta and proximal iliac vessels. A left pelvic sidewall lymph
node measures 1.1 cm in short axis on image 105 of series 2 and
there is nodularity anterior to the left external iliac vasculature
measuring 1.3 cm in short axis on image 105 of series 2 which could
represent tumor nodularity along the inferior omentum, or an
external iliac lymph node. Indistinctness of fat planes along the
porta hepatis probably represents small confluent ill-defined lymph
nodes.

Reproductive: Unremarkable

Other: No ascites.  No overt omental or mesenteric caking of tumor.

Musculoskeletal: Unremarkable
IMPRESSION: 1. Pathologic and indistinctly marginated retroperitoneal and pelvic
adenopathy as noted above. I favor that the indistinct nodular
density anterior to the left external iliac vessels probably
represents a left external iliac node rather than tumor nodularity
along the inferior omentum.
2. There is a generalized gastric wall thickening along the fundus
of the stomach. There is reportedly a tumor in this vicinity on
endoscopy this morning; presumably this tumor is sessile or
infiltrative given the lack of a polypoid mass. There is some mild
indistinct adenopathy in the gastrohepatic ligament. Possibilities
might include gastric adenocarcinoma or lymphoma. Correlate with
biopsy results.
3. No liver metastatic disease currently identified.

## 2016-05-03 ENCOUNTER — Ambulatory Visit: Payer: BLUE CROSS/BLUE SHIELD | Admitting: Obstetrics & Gynecology

## 2016-12-19 IMAGING — DX DG CHEST 2V
2 series · 2 of 2 positions shown · non-contrast
Comparison: 04/14/2015

CLINICAL DATA: Shortness of breath. History of stomach cancer.
Follow-up.

EXAM:
CHEST  2 VIEW

[chest lat]
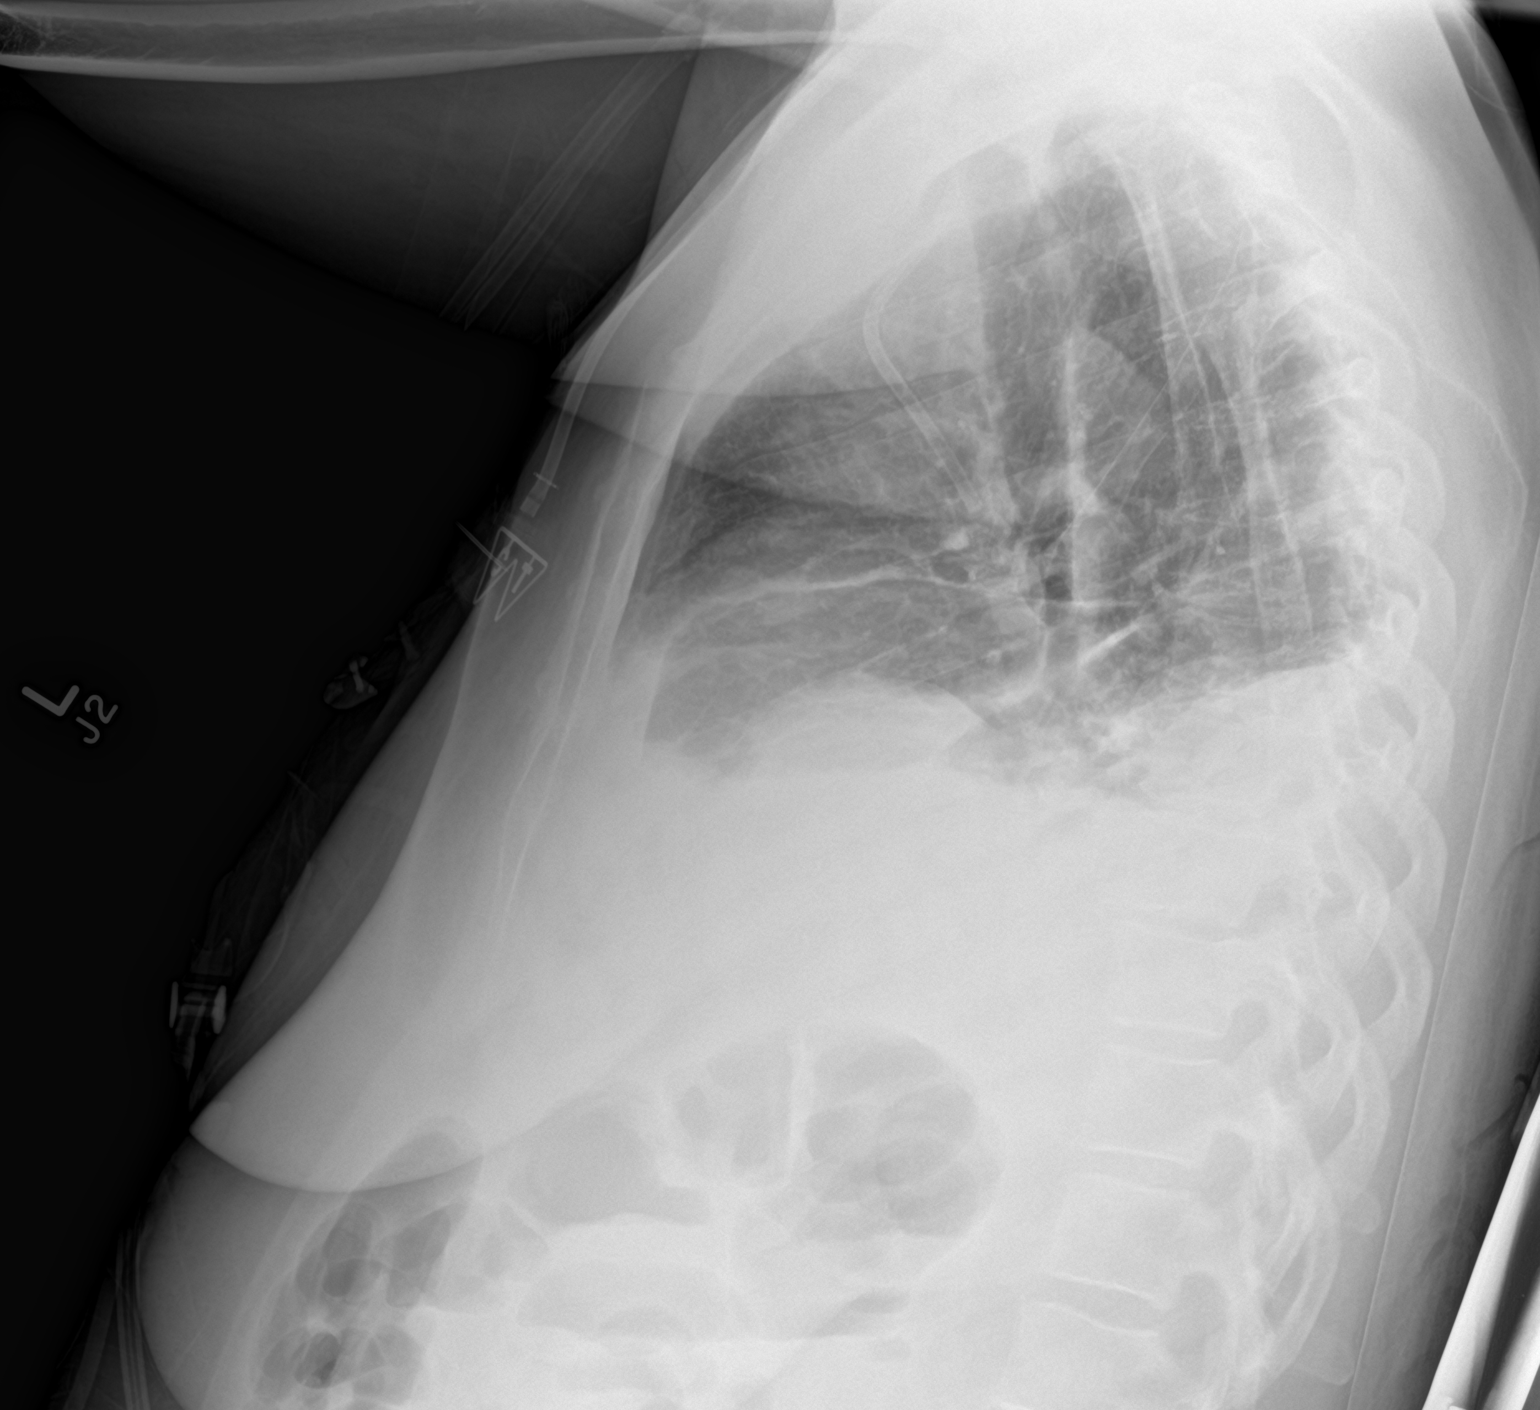

[chest ap]
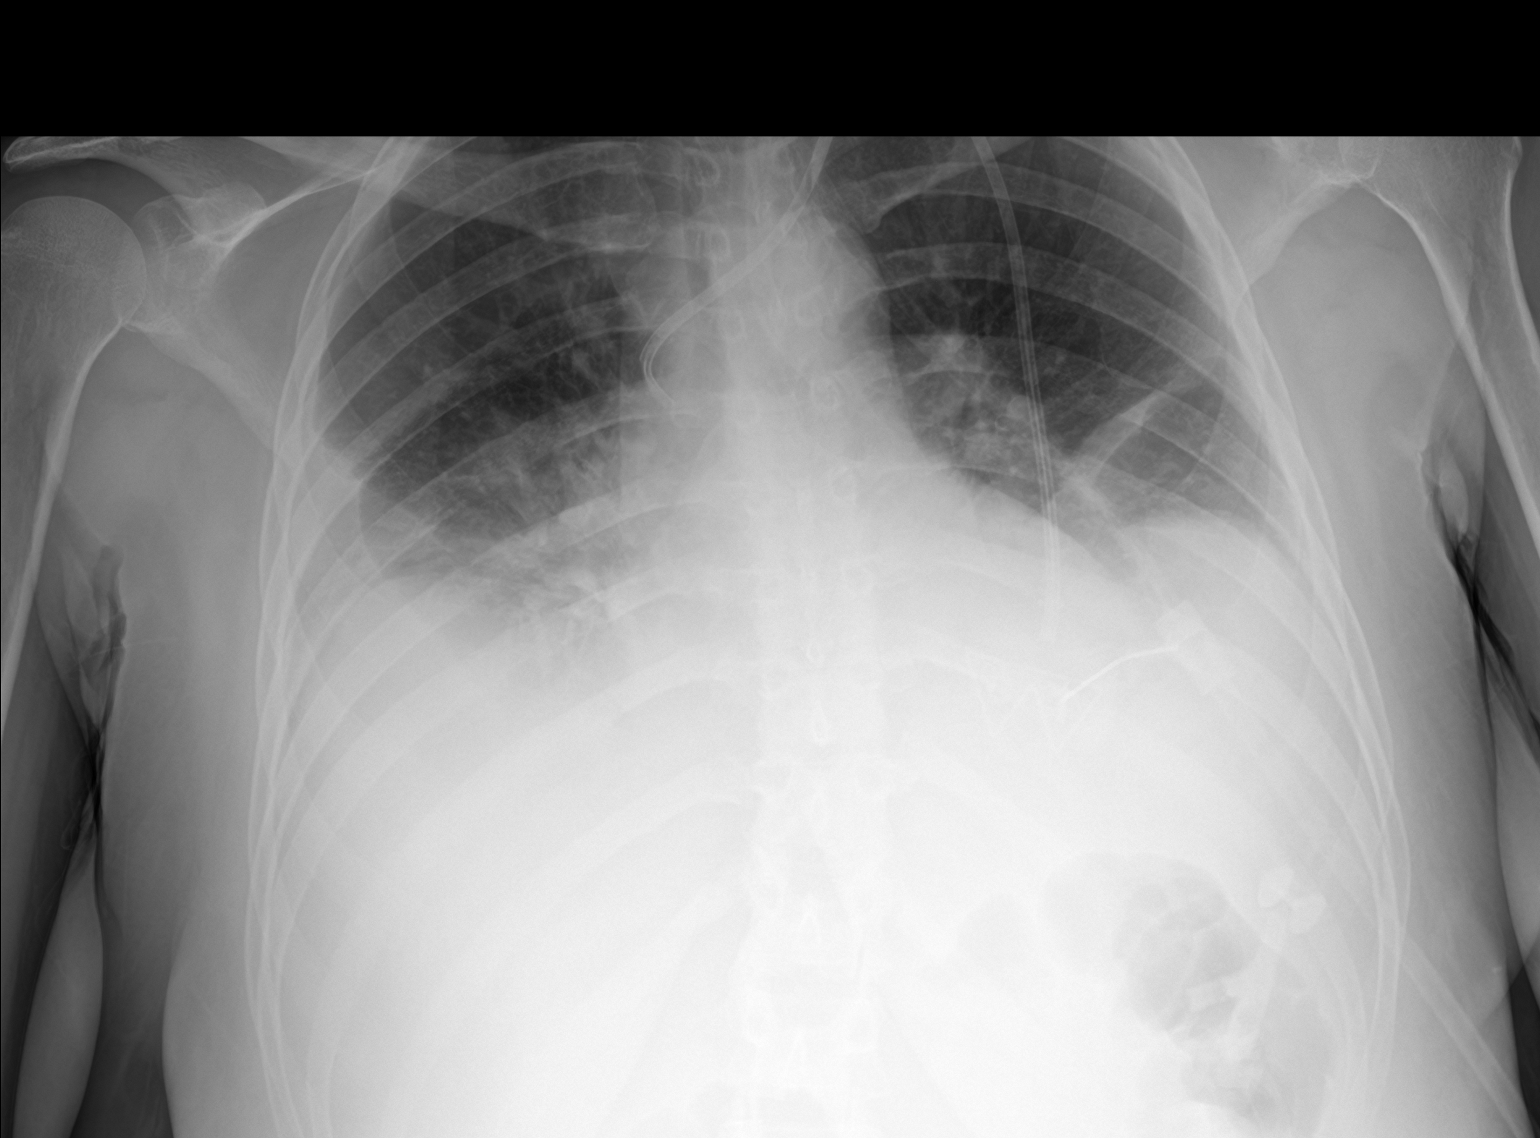

[2 of 2 positions shown; findings below may reference images not displayed]

FINDINGS: Left Port-A-Cath remains in place, unchanged. There are bilateral
layering pleural effusions and lower lobe opacities, likely
atelectasis, similar to prior study. Heart is normal size. No acute
bony abnormality.
IMPRESSION: Bilateral pleural effusions and bibasilar atelectasis, similar to
prior study.
